# Patient Record
Sex: Female | Born: 1975 | Race: Black or African American | Hispanic: No | Marital: Married | Smoking: Never smoker
Health system: Southern US, Community
[De-identification: ages and names within clinical notes are randomized; demographics above are authoritative.]

## PROBLEM LIST (undated history)

## (undated) DIAGNOSIS — A4902 Methicillin resistant Staphylococcus aureus infection, unspecified site: Secondary | ICD-10-CM

## (undated) DIAGNOSIS — Z87898 Personal history of other specified conditions: Secondary | ICD-10-CM

## (undated) DIAGNOSIS — E282 Polycystic ovarian syndrome: Secondary | ICD-10-CM

## (undated) DIAGNOSIS — L680 Hirsutism: Secondary | ICD-10-CM

## (undated) DIAGNOSIS — D571 Sickle-cell disease without crisis: Secondary | ICD-10-CM

## (undated) DIAGNOSIS — E039 Hypothyroidism, unspecified: Secondary | ICD-10-CM

## (undated) DIAGNOSIS — L732 Hidradenitis suppurativa: Secondary | ICD-10-CM

## (undated) DIAGNOSIS — Z975 Presence of (intrauterine) contraceptive device: Secondary | ICD-10-CM

## (undated) DIAGNOSIS — M1990 Unspecified osteoarthritis, unspecified site: Secondary | ICD-10-CM

## (undated) DIAGNOSIS — Z8639 Personal history of other endocrine, nutritional and metabolic disease: Secondary | ICD-10-CM

## (undated) HISTORY — DX: Personal history of other specified conditions: Z87.898

## (undated) HISTORY — DX: Unspecified osteoarthritis, unspecified site: M19.90

## (undated) HISTORY — DX: Presence of (intrauterine) contraceptive device: Z97.5

## (undated) HISTORY — PX: CHOLECYSTECTOMY: SHX55

## (undated) HISTORY — DX: Methicillin resistant Staphylococcus aureus infection, unspecified site: A49.02

## (undated) HISTORY — DX: Hirsutism: L68.0

## (undated) HISTORY — DX: Hypothyroidism, unspecified: E03.9

## (undated) HISTORY — PX: REDUCTION MAMMAPLASTY: SUR839

## (undated) HISTORY — DX: Personal history of other endocrine, nutritional and metabolic disease: Z86.39

## (undated) HISTORY — DX: Hidradenitis suppurativa: L73.2

## (undated) HISTORY — DX: Polycystic ovarian syndrome: E28.2

## (undated) HISTORY — DX: Sickle-cell disease without crisis: D57.1

---

## 1997-05-22 HISTORY — PX: BREAST REDUCTION SURGERY: SHX8

## 2013-06-04 HISTORY — PX: THYROIDECTOMY: SHX17

## 2015-03-19 LAB — HM MAMMOGRAPHY

## 2015-08-21 HISTORY — PX: OTHER SURGICAL HISTORY: SHX169

## 2016-05-01 ENCOUNTER — Ambulatory Visit: Payer: Self-pay | Admitting: Nurse Practitioner

## 2016-06-12 ENCOUNTER — Ambulatory Visit: Payer: Self-pay | Admitting: Nurse Practitioner

## 2016-07-12 ENCOUNTER — Encounter: Payer: Self-pay | Admitting: Family Medicine

## 2016-07-12 ENCOUNTER — Ambulatory Visit (INDEPENDENT_AMBULATORY_CARE_PROVIDER_SITE_OTHER): Payer: Managed Care, Other (non HMO) | Admitting: Family Medicine

## 2016-07-12 VITALS — BP 122/78 | HR 90 | Ht 65.75 in | Wt 194.8 lb

## 2016-07-12 DIAGNOSIS — Z975 Presence of (intrauterine) contraceptive device: Secondary | ICD-10-CM

## 2016-07-12 DIAGNOSIS — Z8639 Personal history of other endocrine, nutritional and metabolic disease: Secondary | ICD-10-CM | POA: Diagnosis not present

## 2016-07-12 DIAGNOSIS — L732 Hidradenitis suppurativa: Secondary | ICD-10-CM | POA: Insufficient documentation

## 2016-07-12 DIAGNOSIS — Z87898 Personal history of other specified conditions: Secondary | ICD-10-CM

## 2016-07-12 DIAGNOSIS — Z8742 Personal history of other diseases of the female genital tract: Secondary | ICD-10-CM

## 2016-07-12 DIAGNOSIS — L680 Hirsutism: Secondary | ICD-10-CM | POA: Insufficient documentation

## 2016-07-12 DIAGNOSIS — M25561 Pain in right knee: Secondary | ICD-10-CM | POA: Diagnosis not present

## 2016-07-12 DIAGNOSIS — Z1239 Encounter for other screening for malignant neoplasm of breast: Secondary | ICD-10-CM

## 2016-07-12 DIAGNOSIS — E890 Postprocedural hypothyroidism: Secondary | ICD-10-CM

## 2016-07-12 DIAGNOSIS — M25562 Pain in left knee: Secondary | ICD-10-CM

## 2016-07-12 DIAGNOSIS — A4902 Methicillin resistant Staphylococcus aureus infection, unspecified site: Secondary | ICD-10-CM | POA: Insufficient documentation

## 2016-07-12 DIAGNOSIS — Z1231 Encounter for screening mammogram for malignant neoplasm of breast: Secondary | ICD-10-CM

## 2016-07-12 LAB — COMPREHENSIVE METABOLIC PANEL
ALBUMIN: 4.1 g/dL (ref 3.6–5.1)
ALT: 9 U/L (ref 6–29)
AST: 12 U/L (ref 10–30)
Alkaline Phosphatase: 76 U/L (ref 33–115)
BILIRUBIN TOTAL: 0.8 mg/dL (ref 0.2–1.2)
BUN: 10 mg/dL (ref 7–25)
CALCIUM: 9.4 mg/dL (ref 8.6–10.2)
CHLORIDE: 104 mmol/L (ref 98–110)
CO2: 26 mmol/L (ref 20–31)
Creat: 0.73 mg/dL (ref 0.50–1.10)
Glucose, Bld: 87 mg/dL (ref 65–99)
Potassium: 4.5 mmol/L (ref 3.5–5.3)
Sodium: 139 mmol/L (ref 135–146)
Total Protein: 7 g/dL (ref 6.1–8.1)

## 2016-07-12 LAB — CBC WITH DIFFERENTIAL/PLATELET
BASOS ABS: 0 {cells}/uL (ref 0–200)
Basophils Relative: 0 %
Eosinophils Absolute: 62 cells/uL (ref 15–500)
Eosinophils Relative: 1 %
HEMATOCRIT: 37.8 % (ref 35.0–45.0)
HEMOGLOBIN: 12 g/dL (ref 11.7–15.5)
LYMPHS ABS: 2666 {cells}/uL (ref 850–3900)
Lymphocytes Relative: 43 %
MCH: 28.3 pg (ref 27.0–33.0)
MCHC: 31.7 g/dL — AB (ref 32.0–36.0)
MCV: 89.2 fL (ref 80.0–100.0)
MPV: 10.1 fL (ref 7.5–12.5)
Monocytes Absolute: 372 cells/uL (ref 200–950)
Monocytes Relative: 6 %
NEUTROS PCT: 50 %
Neutro Abs: 3100 cells/uL (ref 1500–7800)
Platelets: 340 10*3/uL (ref 140–400)
RBC: 4.24 MIL/uL (ref 3.80–5.10)
RDW: 13.8 % (ref 11.0–15.0)
WBC: 6.2 10*3/uL (ref 4.0–10.5)

## 2016-07-12 LAB — LIPID PANEL
CHOL/HDL RATIO: 2.6 ratio (ref <5.0)
CHOLESTEROL: 188 mg/dL (ref <200)
HDL: 72 mg/dL (ref >50)
LDL Cholesterol: 104 mg/dL — ABNORMAL HIGH (ref <100)
TRIGLYCERIDES: 62 mg/dL (ref <150)
VLDL: 12 mg/dL (ref <30)

## 2016-07-12 LAB — TSH: TSH: 2.4 mIU/L

## 2016-07-12 NOTE — Progress Notes (Signed)
Subjective:    Patient ID: Joanne Hughes, female    DOB: Nov 02, 1975, 41 y.o.   MRN: [id number]  HPI Chief Complaint  Patient presents with  . new pt    new pt get established. knee pain- would like blood work done today jsut to see if anything is going on.    She is new to the practice and here to establish care.  Previous medical care: in [place], [place]. Moved here in September 2017.  Last CPE: April 2017   Complains of a 2-4 week history of gradually worsening left knee pain that is mainly medial and only bothersome when walking up and down steps.  She also reports anterior right knee pain that is mild and ongoing for about the same length of time. Denies fever, chills, knee swelling, locking, popping or giving away. No pain when sitting or when walking straight away or even on an incline.  She has taken naproxen with relief. Has not stopped doing lunges and squats at her gym. States she has even tried jogging some.   Reports questionable hairline fractures in tibia, cannot recall the details and no records of this.   Denies fever, chills, dizziness, chest pain, palpitations, shortness of breath, abdominal pain, N/V/D. No calf pain.   Other providers: none in the area. She had an endocrinologist in [place].   Past medical history: hypothyroidism post thyroidectomy for  Prediabetes. History of elevated hemoglobin A1c but not in diabetes range.   Surgeries: gastric sleeve April 2017.  Has done well with this and weight loss almost 100 lbs since the surgery.  Thyroid removed due to large goiter January 2015.  Bilateral arm pit cyst removal. Breast reduction.   Family history: stroke in father. HTN both parents. Sister with diabetes. Thyroid issues in several family members.   Social history: Lives with husband and son, works for [employer] in [employer].  Diet: mainly healthy Excerise: most days per week.   Immunizations: Up to date.   Health maintenance:  Mammogram: 2016.  States she is due for this.  Colonoscopy: N/A Pap Smear: April 2017 Has an IUD that is due to be removed in the next year and would like a new one. Will need referral for this.  Last Menstrual cycle: irregular  Pregnancies: 1  Reviewed allergies, medications, past medical, surgical, family, and social history.    Review of Systems Pertinent positives and negatives in the history of present illness.     Objective:   Physical Exam  Constitutional: She is oriented to person, place, and time. She appears well-developed and well-nourished. No distress.  Cardiovascular: Normal rate, regular rhythm, normal heart sounds and intact distal pulses.   Pulmonary/Chest: Effort normal and breath sounds normal.  Musculoskeletal:       Right knee: Normal.       Left knee: Normal.  No erythema, effusion, tenderness, laxity. Normal patellar motion. Normal ROM. Negative Mcmurrays.  LEs are neurovascularly intact.   Neurological: She is alert and oriented to person, place, and time.  Skin: Skin is warm and dry. No erythema.   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 Advised her to stop doing lunges, squats and jogging. Encouraged low impact exercises. She may take Tylenol or Aleve as needed, ice, and demonstrated quad strengthening exercises for her to  do such as terminal quad extensions. She will follow up if not improving in the next 2-3 weeks or if the pain gets worse.  Will check TSH and ensure she is taking the correct dose of levothyroxine.  she appears to be very aware of her health and takes good care of herself overall. She is up to date She would like to see a dermatologist and she will call and schedule this.  Requests to get a mammogram. Order placed.  She would like to get her IUD removed and a new one placed in the next few months. Will refer her to OB/GYN.  Follow up pending labs.

## 2016-07-12 NOTE — Patient Instructions (Addendum)
Stop doing lunges and squats and exercises that aggravate your knee. Do the knee strengthening exercises we discussed. Do low impact exercises for now.  You can take Tylenol or naproxen.  Follow up if you are not noticing improvement in the next 2-3 weeks.   Patellofemoral Pain Syndrome Introduction Patellofemoral pain syndrome is a condition that involves a softening or breakdown of the tissue (cartilage) on the underside of your kneecap (patella). This causes pain in the front of the knee. The condition is also called runner's knee or chondromalacia patella. Patellofemoral pain syndrome is most common in young adults who are active in sports. Your knee is the largest joint in your body. The patella covers the front of your knee and is attached to muscles above and below your knee. The underside of the patella is covered with a smooth type of cartilage (synovium). The smooth surface helps the patella glide easily when you move your knee. Patellofemoral pain syndrome causes swelling in the joint linings and bone surfaces in your knee. What are the causes? Patellofemoral pain syndrome can be caused by:  Overuse.  Poor alignment of your knee joints.  Weak leg muscles.  A direct blow to your kneecap. What increases the risk? You may be at risk for patellofemoral pain syndrome if you:  Do a lot of activities that can wear down your kneecap. These include:  Running.  Squatting.  Climbing stairs.  Start a new physical activity or exercise program.  Wear shoes that do not fit well.  Do not have good leg strength.  Are overweight. What are the signs or symptoms? Knee pain is the most common symptom of patellofemoral pain syndrome. This may feel like a dull, aching pain underneath your patella, in the front of your knee. There may be a popping or cracking sound when you move your knee. Pain may get worse with:  Exercise.  Climbing  stairs.  Running.  Jumping.  Squatting.  Kneeling.  Sitting for a long time.  Moving or pushing on your patella. How is this diagnosed? Your health care provider may be able to diagnose patellofemoral pain syndrome from your symptoms and medical history. You may be asked about your recent physical activities and which ones cause knee pain. Your health care provider may do a physical exam with certain tests to confirm the diagnosis. These may include:  Moving your patella back and forth.  Checking your range of knee motion.  Having you squat or jump to see if you have pain.  Checking the strength of your leg muscles. An MRI of the knee may also be done. How is this treated? Patellofemoral pain syndrome can usually be treated at home with rest, ice, compression, and elevation (RICE). Other treatments may include:  Nonsteroidal anti-inflammatory drugs (NSAIDs).  Physical therapy to stretch and strengthen your leg muscles.  Shoe inserts (orthotics) to take stress off your knee.  A knee brace or knee support.  Surgery to remove damaged cartilage or move the patella to a better position. The need for surgery is rare. Follow these instructions at home:  Take medicines only as directed by your health care provider.  Rest your knee.  When resting, keep your knee raised above the level of your heart.  Avoid activities that cause knee pain.  Apply ice to the injured area:  Put ice in a plastic bag.  Place a towel between your skin and the bag.  Leave the ice on for 20 minutes, 2-3 times a day.  Use splints, braces, knee supports, or walking aids as directed by your health care provider.  Perform stretching and strengthening exercises as directed by your health care provider or physical therapist.  Keep all follow-up visits as directed by your health care provider. This is important. Contact a health care provider if:  Your symptoms get worse.  You are not improving  with home care. This information is not intended to replace advice given to you by your health care provider. Make sure you discuss any questions you have with your health care provider. Document Released: 04/26/2009 Document Revised: 10/14/2015 Document Reviewed: 07/28/2013  2017 Elsevier

## 2016-07-13 ENCOUNTER — Other Ambulatory Visit: Payer: Self-pay | Admitting: Family Medicine

## 2016-07-13 MED ORDER — LEVOTHYROXINE SODIUM 137 MCG PO TABS: 137.0000 ug | ORAL_TABLET | Freq: Every day | ORAL | 5 refills | Status: DC

## 2016-07-14 ENCOUNTER — Encounter: Payer: Self-pay | Admitting: Family Medicine

## 2016-07-25 ENCOUNTER — Encounter: Payer: Self-pay | Admitting: Internal Medicine

## 2016-08-07 ENCOUNTER — Ambulatory Visit
Admission: RE | Admit: 2016-08-07 | Discharge: 2016-08-07 | Disposition: A | Discharge status: Home / Self Care | Payer: Managed Care, Other (non HMO) | Source: Ambulatory Visit | Attending: Family Medicine | Admitting: Family Medicine

## 2016-08-07 DIAGNOSIS — Z1239 Encounter for other screening for malignant neoplasm of breast: Secondary | ICD-10-CM

## 2016-08-08 ENCOUNTER — Encounter: Payer: Self-pay | Admitting: Family Medicine

## 2016-08-09 ENCOUNTER — Other Ambulatory Visit: Payer: Self-pay | Admitting: Family Medicine

## 2016-08-09 DIAGNOSIS — M25562 Pain in left knee: Secondary | ICD-10-CM

## 2016-08-22 ENCOUNTER — Encounter: Payer: Self-pay | Admitting: Family Medicine

## 2016-08-22 ENCOUNTER — Ambulatory Visit
Admission: RE | Admit: 2016-08-22 | Discharge: 2016-08-22 | Disposition: A | Discharge status: Home / Self Care | Payer: Managed Care, Other (non HMO) | Source: Ambulatory Visit | Attending: Family Medicine | Admitting: Family Medicine

## 2016-08-22 DIAGNOSIS — M25562 Pain in left knee: Secondary | ICD-10-CM

## 2016-08-22 DIAGNOSIS — M25561 Pain in right knee: Secondary | ICD-10-CM

## 2016-08-28 ENCOUNTER — Ambulatory Visit (INDEPENDENT_AMBULATORY_CARE_PROVIDER_SITE_OTHER): Payer: Managed Care, Other (non HMO) | Admitting: Obstetrics and Gynecology

## 2016-08-28 ENCOUNTER — Encounter: Payer: Self-pay | Admitting: Obstetrics and Gynecology

## 2016-08-28 VITALS — BP 139/88 | HR 71 | Ht 65.0 in | Wt 193.0 lb

## 2016-08-28 DIAGNOSIS — Z113 Encounter for screening for infections with a predominantly sexual mode of transmission: Secondary | ICD-10-CM

## 2016-08-28 DIAGNOSIS — Z124 Encounter for screening for malignant neoplasm of cervix: Secondary | ICD-10-CM

## 2016-08-28 DIAGNOSIS — Z30433 Encounter for removal and reinsertion of intrauterine contraceptive device: Secondary | ICD-10-CM | POA: Diagnosis not present

## 2016-08-28 DIAGNOSIS — Z01419 Encounter for gynecological examination (general) (routine) without abnormal findings: Secondary | ICD-10-CM

## 2016-08-28 MED ORDER — LEVONORGESTREL 20 MCG/24HR IU IUD
INTRAUTERINE_SYSTEM | Freq: Once | INTRAUTERINE | Status: AC
  Administered 2016-08-28: 14:00:00 1 via INTRAUTERINE

## 2016-08-28 NOTE — Progress Notes (Signed)
Patient is in the office for annual exam. 

## 2016-08-28 NOTE — Progress Notes (Signed)
Subjective:     Joanne Hughes is a 41 y.o. female G80P1011 with BMI 32 who is here for a comprehensive physical exam. The patient reports no problems. She is sexually active using IUD for contraception. She has had the Mirena in place for the past 6 years and desires removal and replacement. She denies any pelvic pain or abnormal vaginal discharge  Past Medical History:  Diagnosis Date  . Hidradenitis   . Hirsutism    tried spironolactone and vaniqa in past without success  . History of elevated lipids   . History of prediabetes    A1c 5.9% January 2017  . Hypothyroid   . IUD contraception   . MRSA (methicillin resistant Staphylococcus aureus)    per medical record from [place]  . PCOS (polycystic ovarian syndrome)    Past Surgical History:  Procedure Laterality Date  . BREAST REDUCTION SURGERY  05/1997  . CESAREAN SECTION    . CHOLECYSTECTOMY    . gastric sleeve surgery  08/2015   surgeon Dr. Roanna Raider of [place]  . THYROIDECTOMY  06/04/2013   surgeon Dr. Haywood Pao of [place]   Family History  Problem Relation Age of Onset  . Hypertension Mother   . Hypothyroidism Mother   . Hypertension Father   . Stroke Father   . Diabetes Sister   . Leukemia Sister      Social History   Social History  . Marital status: Married    Spouse name: N/A  . Number of children: N/A  . Years of education: N/A   Occupational History  . Not on file.   Social History Main Topics  . Smoking status: Never Smoker  . Smokeless tobacco: Never Used  . Alcohol use 1.2 oz/week    2 Glasses of wine per week  . Drug use: No  . Sexual activity: Yes    Birth control/ protection: IUD   Other Topics Concern  . Not on file   Social History Narrative  . No narrative on file   Health Maintenance  Topic Date Due  . HIV Screening  10/15/1989  . TETANUS/TDAP  10/15/1993  . PAP SMEAR  10/16/1995  . INFLUENZA VACCINE  12/20/2016       Review of Systems Pertinent items are noted in  HPI.   Objective:  Blood pressure 139/88, pulse 71, height 5\' 5"  (1.651 m), weight 193 lb (87.5 kg), last menstrual period 08/26/2016.     GENERAL: Well-developed, well-nourished female in no acute distress.  HEENT: Normocephalic, atraumatic. Sclerae anicteric.  NECK: Supple. Normal thyroid.  LUNGS: Clear to auscultation bilaterally.  HEART: Regular rate and rhythm. BREASTS: Symmetric in size. No palpable masses or lymphadenopathy, skin changes, or nipple drainage. ABDOMEN: Soft, nontender, nondistended.  PELVIC: Normal external female genitalia. Vagina is pink and rugated.  Normal discharge. Normal appearing cervix. Uterus is normal in size. No adnexal mass or tenderness. EXTREMITIES: No cyanosis, clubbing, or edema, 2+ distal pulses.    Assessment:    Healthy female exam.      Plan:    pap smear collected Patient with normal mammogram in March Patient will be contacted with any abnormal results  [place] NOTE  IUD Removal  Patient identified, informed consent performed, consent signed.  Patient was in the dorsal lithotomy position, normal external genitalia was noted.  A speculum was placed in the patient's vagina, normal discharge was noted, no lesions. The cervix was visualized, no lesions, no abnormal discharge.  The strings of the IUD  were grasped and pulled using ring forceps. The IUD was removed in its entirety.     IUD Insertion Cervix  was cleaned with Betadine x 2.  Grasped anteriorly with a single tooth tenaculum.  Uterus sounded to 9 cm.  Mirena IUD placed per manufacturer's recommendations.  Strings trimmed to 3 cm. Tenaculum was removed, good hemostasis noted.  Patient tolerated procedure well.   Patient given post procedure instructions and Mirena care card with expiration date.  Patient is asked to check IUD strings periodically and follow up in 4-6 weeks for IUD check.

## 2016-08-29 LAB — CERVICOVAGINAL ANCILLARY ONLY
Bacterial vaginitis: NEGATIVE
Candida vaginitis: NEGATIVE
Chlamydia: NEGATIVE
NEISSERIA GONORRHEA: NEGATIVE
TRICH (WINDOWPATH): NEGATIVE

## 2016-08-30 ENCOUNTER — Encounter: Payer: Self-pay | Admitting: Family Medicine

## 2016-08-30 LAB — CYTOLOGY - PAP
Diagnosis: NEGATIVE
HPV: NOT DETECTED

## 2016-09-12 ENCOUNTER — Ambulatory Visit (INDEPENDENT_AMBULATORY_CARE_PROVIDER_SITE_OTHER): Payer: Managed Care, Other (non HMO) | Admitting: Orthopaedic Surgery

## 2016-09-12 DIAGNOSIS — M25562 Pain in left knee: Secondary | ICD-10-CM | POA: Diagnosis not present

## 2016-09-12 MED ORDER — LIDOCAINE HCL 1 % IJ SOLN
3.0000 mL | INTRAMUSCULAR | Status: AC | PRN
  Administered 2016-09-12: 3 mL

## 2016-09-12 MED ORDER — METHYLPREDNISOLONE ACETATE 40 MG/ML IJ SUSP
40.0000 mg | INTRAMUSCULAR | Status: AC | PRN
  Administered 2016-09-12: 40 mg via INTRA_ARTICULAR

## 2016-09-12 NOTE — Progress Notes (Signed)
Office Visit Note   Patient: Joanne Hughes           Date of Birth: 23-Jul-1975           MRN: [id number] Visit Date: 09/12/2016              Requested by: Girtha Rm, NP-C [place], [place] [id number] PCP: Harland Dingwall, NP-C   Assessment & Plan: Visit Diagnoses:  1. Acute pain of left knee     Plan: We talked in length about her knee and thinks she can do to help strengthen her quad muscles which I think will help her knees in general. We will focus on the left knee for now. Her right knee is not having the same symptoms just a little bit of pain and maybe over compensating. I had her demonstrate quad exercises back to me and I showed her effusion to try. I offered her steroid injection in her left knee to see if this would help with synovitis. I prepped her knee with better alcohol and the injection well. We'll put for follow-up in 4 weeks to see if this is done well for her. She continues to have problems with pain on pivoting activities will may consider an MRI of her left knee. All questions were encouraged and answered.  Follow-Up Instructions: Return in about 4 weeks (around 10/10/2016).   Orders:  No orders of the defined types were placed in this encounter.  No orders of the defined types were placed in this encounter.     Procedures: Large Joint Inj Date/Time: 09/12/2016 10:04 AM Performed by: Mcarthur Rossetti Authorized by: Mcarthur Rossetti   Location:  Knee Site:  L knee Ultrasound Guidance: No   Fluoroscopic Guidance: No   Arthrogram: No   Medications:  3 mL lidocaine 1 %; 40 mg methylPREDNISolone acetate 40 MG/ML     Clinical Data: No additional findings.   Subjective: No chief complaint on file. The patient comes in with chief complaint of bilateral knee pain is really her left is been hurting her worse. She points the medial joint line as source of her pain. She denies any specific injury. She is to be morbidly obese at  a weight of close to 300 pounds and had gastric bypass surgery and she is also at least 80 pounds or more. She is working on gaining more weight off. She's not been exercising quite a bit. She would like to train eventually for a 5K run. She denies any locking catching but does report pain in the left knee mainly on the medial joint lines were she points and says it hurts only with pivoting types of activities and some going up and downstairs. She's not had significant swelling in the knee. She is incredibly pleasant individual.  HPI  Review of Systems Denies any chest pain, headache, short of breath, fever, chills, vomiting.  Objective: Vital Signs: LMP 08/26/2016   Physical Exam He is alert and oriented 3 and in no acute distress Ortho Exam Examination of both knee show no effusion. Both knees have patella to track well with minimal patellofemoral crepitation. Both knees actually have the ability to hyperextend and fill ligamentously lax but are equal bilaterally. On the left side she does have some slight medial joint line tenderness but a negative McMurray sign. Range of motion of both knees are full. She is neurovascular intact bilaterally. Specialty Comments:  No specialty comments available.  Imaging: No results found. X-rays on the  canopy system and inability reviewed by me of her left knee show well-maintained joint space. There is no acute findings. She has slight patellofemoral arthritic changes but it's only mild. Overall I'm is well-maintained.  PMFS History: Patient Active Problem List   Diagnosis Date Noted  . Acute pain of left knee 09/12/2016  . History of prediabetes 07/12/2016  . History of elevated lipids 07/12/2016  . Postoperative hypothyroidism 07/12/2016  . History of PCOS 07/12/2016  . IUD contraception 07/12/2016  . Hirsutism   . Hidradenitis   . MRSA (methicillin resistant Staphylococcus aureus)    Past Medical History:  Diagnosis Date  . Hidradenitis     . Hirsutism    tried spironolactone and vaniqa in past without success  . History of elevated lipids   . History of prediabetes    A1c 5.9% January 2017  . Hypothyroid   . IUD contraception   . MRSA (methicillin resistant Staphylococcus aureus)    per medical record from [place]  . PCOS (polycystic ovarian syndrome)     Family History  Problem Relation Age of Onset  . Hypertension Mother   . Hypothyroidism Mother   . Hypertension Father   . Stroke Father   . Diabetes Sister   . Leukemia Sister     Past Surgical History:  Procedure Laterality Date  . BREAST REDUCTION SURGERY  05/1997  . CESAREAN SECTION    . CHOLECYSTECTOMY    . gastric sleeve surgery  08/2015   surgeon Dr. Roanna Raider of [place]  . THYROIDECTOMY  06/04/2013   surgeon Dr. Haywood Pao of [place]   Social History   Occupational History  . Not on file.   Social History Main Topics  . Smoking status: Never Smoker  . Smokeless tobacco: Never Used  . Alcohol use 1.2 oz/week    2 Glasses of wine per week  . Drug use: No  . Sexual activity: Yes    Birth control/ protection: IUD

## 2016-09-14 ENCOUNTER — Encounter: Payer: Self-pay | Admitting: Family Medicine

## 2016-09-15 MED ORDER — KETOCONAZOLE 2 % EX SHAM: 1.0000 "application " | MEDICATED_SHAMPOO | CUTANEOUS | 0 refills | Status: DC

## 2016-09-21 ENCOUNTER — Encounter (INDEPENDENT_AMBULATORY_CARE_PROVIDER_SITE_OTHER): Payer: Self-pay | Admitting: Orthopaedic Surgery

## 2016-09-25 ENCOUNTER — Ambulatory Visit (INDEPENDENT_AMBULATORY_CARE_PROVIDER_SITE_OTHER): Payer: Managed Care, Other (non HMO) | Admitting: Obstetrics and Gynecology

## 2016-09-25 ENCOUNTER — Encounter: Payer: Self-pay | Admitting: Obstetrics and Gynecology

## 2016-09-25 VITALS — BP 133/88 | HR 76 | Wt 192.0 lb

## 2016-09-25 DIAGNOSIS — Z30431 Encounter for routine checking of intrauterine contraceptive device: Secondary | ICD-10-CM | POA: Diagnosis not present

## 2016-09-25 NOTE — Progress Notes (Signed)
41 yo s/p IUD placement on 4/9 presenting today for string check. Patient denies any pelvic pain. She reports some light vaginal spotting. She has had intercourse without pain or discomfort.  Past Medical History:  Diagnosis Date  . Hidradenitis   . Hirsutism    tried spironolactone and vaniqa in past without success  . History of elevated lipids   . History of prediabetes    A1c 5.9% January 2017  . Hypothyroid   . IUD contraception   . MRSA (methicillin resistant Staphylococcus aureus)    per medical record from [place]  . PCOS (polycystic ovarian syndrome)    Past Surgical History:  Procedure Laterality Date  . BREAST REDUCTION SURGERY  05/1997  . CESAREAN SECTION    . CHOLECYSTECTOMY    . gastric sleeve surgery  08/2015   surgeon Dr. Roanna Raider of [place]  . THYROIDECTOMY  06/04/2013   surgeon Dr. Haywood Pao of [place]   Family History  Problem Relation Age of Onset  . Hypertension Mother   . Hypothyroidism Mother   . Hypertension Father   . Stroke Father   . Diabetes Sister   . Leukemia Sister    Social History  Substance Use Topics  . Smoking status: Never Smoker  . Smokeless tobacco: Never Used  . Alcohol use 1.2 oz/week    2 Glasses of wine per week   ROS See pertinent in HPI  GENERAL: Well-developed, well-nourished female in no acute distress.  ABDOMEN: Soft, nontender, nondistended.  PELVIC: Normal external female genitalia. Vagina is pink and rugated.  Normal discharge. Normal appearing cervix with IUD strings visualized extending from cervical os. Uterus is normal in size. No adnexal mass or tenderness. EXTREMITIES: No cyanosis, clubbing, or edema, 2+ distal pulses.  A/P 41 yo here for IUD check - IUD appears to be in the appropriate location - Normal pap results from 08/28/2016 reviewed with the patient - RTC prn

## 2016-09-25 NOTE — Progress Notes (Signed)
Patient presents for String Check

## 2016-09-26 ENCOUNTER — Encounter: Payer: Self-pay | Admitting: Family Medicine

## 2016-09-26 DIAGNOSIS — M25561 Pain in right knee: Secondary | ICD-10-CM

## 2016-10-03 ENCOUNTER — Ambulatory Visit
Admission: RE | Admit: 2016-10-03 | Discharge: 2016-10-03 | Disposition: A | Discharge status: Home / Self Care | Payer: Managed Care, Other (non HMO) | Source: Ambulatory Visit | Attending: Family Medicine | Admitting: Family Medicine

## 2016-10-03 DIAGNOSIS — M25561 Pain in right knee: Secondary | ICD-10-CM

## 2016-10-04 ENCOUNTER — Encounter: Payer: Self-pay | Admitting: Family Medicine

## 2016-10-09 ENCOUNTER — Ambulatory Visit: Payer: Self-pay | Admitting: Family Medicine

## 2016-10-10 ENCOUNTER — Encounter: Payer: Self-pay | Admitting: Family Medicine

## 2016-10-10 ENCOUNTER — Ambulatory Visit (INDEPENDENT_AMBULATORY_CARE_PROVIDER_SITE_OTHER): Payer: Managed Care, Other (non HMO) | Admitting: Orthopaedic Surgery

## 2016-10-11 ENCOUNTER — Encounter: Payer: Self-pay | Admitting: Family Medicine

## 2016-11-10 ENCOUNTER — Encounter: Payer: Self-pay | Admitting: Family Medicine

## 2016-11-10 MED ORDER — LEVOTHYROXINE SODIUM 137 MCG PO TABS: 137.0000 ug | ORAL_TABLET | Freq: Every day | ORAL | 1 refills | Status: DC

## 2017-02-05 ENCOUNTER — Encounter: Payer: Self-pay | Admitting: Family Medicine

## 2017-03-01 ENCOUNTER — Other Ambulatory Visit (INDEPENDENT_AMBULATORY_CARE_PROVIDER_SITE_OTHER): Payer: Managed Care, Other (non HMO)

## 2017-03-01 DIAGNOSIS — Z23 Encounter for immunization: Secondary | ICD-10-CM

## 2017-03-15 ENCOUNTER — Encounter: Payer: Self-pay | Admitting: Family Medicine

## 2017-07-02 ENCOUNTER — Encounter: Payer: Self-pay | Admitting: Family Medicine

## 2017-07-09 ENCOUNTER — Encounter: Payer: Self-pay | Admitting: Family Medicine

## 2017-07-09 ENCOUNTER — Ambulatory Visit: Payer: Managed Care, Other (non HMO) | Admitting: Family Medicine

## 2017-07-09 VITALS — BP 124/90 | HR 86 | Wt 191.0 lb

## 2017-07-09 DIAGNOSIS — Z79899 Other long term (current) drug therapy: Secondary | ICD-10-CM | POA: Diagnosis not present

## 2017-07-09 DIAGNOSIS — E890 Postprocedural hypothyroidism: Secondary | ICD-10-CM | POA: Diagnosis not present

## 2017-07-09 DIAGNOSIS — R252 Cramp and spasm: Secondary | ICD-10-CM | POA: Diagnosis not present

## 2017-07-09 DIAGNOSIS — Z87898 Personal history of other specified conditions: Secondary | ICD-10-CM

## 2017-07-09 NOTE — Patient Instructions (Signed)
Eat a low sodium diet and make sure you are well hydrated (your urine should be a light yellow).   We will call you with your lab results.

## 2017-07-09 NOTE — Progress Notes (Signed)
   Subjective:    Patient ID: Joanne Hughes, female    DOB: Oct 02, 1975, 42 y.o.   MRN: [id number]  HPI Chief Complaint  Patient presents with  . cramping    cramping in legs and toes moving in different directions. wants potassium and calcium checked.    She is here with complaints of bilateral LE cramping one night last week. Cramping intermittent in her left foot at times.   She had a gastric bypass 2 years ago. Is concerned that she may not be getting adequate vitamins and minerals.   Denies fever, chills, dizziness, fatigue, night sweats, chest pain, palpitations, shortness of breath, abdominal pain, N/V/D.   She is taking levothyroxine and has not followed up for TSH check.   History of prediabetes.   IUD in place.   Reviewed allergies, medications, past medical, surgical, family, and social history.    Review of Systems Pertinent positives and negatives in the history of present illness.     Objective:   Physical Exam BP 124/90   Pulse 86   Wt 191 lb (86.6 kg)   SpO2 99%   BMI 31.78 kg/m  Alert and in no distress.  Pharyngeal area is normal. Neck is supple without adenopathy or thyromegaly. Cardiac exam shows a regular sinus rhythm without murmurs or gallops. Lungs are clear to auscultation. Extremities without edema, normal pulses.       Assessment & Plan:  Foot cramps - Plan: CBC with Differential/Platelet, Comprehensive metabolic panel, TSH, Magnesium, Hemoglobin A1c  Postoperative hypothyroidism - Plan: TSH  History of prediabetes - Plan: Hemoglobin A1c  Medication management - Plan: TSH  Foot cramps are intermittent. Discussed hydration status and will check labs. She is taking multiple supplements and has a history of gastric bypass.  Check TSH and follow up. Adjust medication as needed.  Follow up pending labs and for a fasting CPE.

## 2017-07-10 LAB — COMPREHENSIVE METABOLIC PANEL
A/G RATIO: 1.5 (ref 1.2–2.2)
ALBUMIN: 4.4 g/dL (ref 3.5–5.5)
ALT: 13 IU/L (ref 0–32)
AST: 19 IU/L (ref 0–40)
Alkaline Phosphatase: 66 IU/L (ref 39–117)
BILIRUBIN TOTAL: 0.5 mg/dL (ref 0.0–1.2)
BUN / CREAT RATIO: 16 (ref 9–23)
BUN: 13 mg/dL (ref 6–24)
CO2: 23 mmol/L (ref 20–29)
Calcium: 9.4 mg/dL (ref 8.7–10.2)
Chloride: 106 mmol/L (ref 96–106)
Creatinine, Ser: 0.8 mg/dL (ref 0.57–1.00)
GFR calc non Af Amer: 91 mL/min/{1.73_m2} (ref >59)
GFR, EST AFRICAN AMERICAN: 105 mL/min/{1.73_m2} (ref >59)
GLOBULIN, TOTAL: 3 g/dL (ref 1.5–4.5)
Glucose: 88 mg/dL (ref 65–99)
POTASSIUM: 4.1 mmol/L (ref 3.5–5.2)
SODIUM: 144 mmol/L (ref 134–144)
TOTAL PROTEIN: 7.4 g/dL (ref 6.0–8.5)

## 2017-07-10 LAB — CBC WITH DIFFERENTIAL/PLATELET
BASOS: 0 %
Basophils Absolute: 0 10*3/uL (ref 0.0–0.2)
EOS (ABSOLUTE): 0.1 10*3/uL (ref 0.0–0.4)
EOS: 2 %
HEMATOCRIT: 37.7 % (ref 34.0–46.6)
Hemoglobin: 11.9 g/dL (ref 11.1–15.9)
Immature Grans (Abs): 0 10*3/uL (ref 0.0–0.1)
Immature Granulocytes: 0 %
Lymphocytes Absolute: 2.7 10*3/uL (ref 0.7–3.1)
Lymphs: 49 %
MCH: 29.3 pg (ref 26.6–33.0)
MCHC: 31.6 g/dL (ref 31.5–35.7)
MCV: 93 fL (ref 79–97)
Monocytes Absolute: 0.3 10*3/uL (ref 0.1–0.9)
Monocytes: 5 %
NEUTROS ABS: 2.4 10*3/uL (ref 1.4–7.0)
Neutrophils: 44 %
PLATELETS: 363 10*3/uL (ref 150–379)
RBC: 4.06 x10E6/uL (ref 3.77–5.28)
RDW: 13.7 % (ref 12.3–15.4)
WBC: 5.5 10*3/uL (ref 3.4–10.8)

## 2017-07-10 LAB — TSH: TSH: 1.67 u[IU]/mL (ref 0.450–4.500)

## 2017-07-10 LAB — HEMOGLOBIN A1C
Est. average glucose Bld gHb Est-mCnc: 111 mg/dL
Hgb A1c MFr Bld: 5.5 % (ref 4.8–5.6)

## 2017-07-10 LAB — MAGNESIUM: Magnesium: 2 mg/dL (ref 1.6–2.3)

## 2017-08-15 ENCOUNTER — Ambulatory Visit: Payer: Managed Care, Other (non HMO) | Admitting: Family Medicine

## 2017-08-23 ENCOUNTER — Other Ambulatory Visit: Payer: Self-pay | Admitting: Family Medicine

## 2017-08-23 ENCOUNTER — Encounter: Payer: Self-pay | Admitting: Family Medicine

## 2017-08-23 ENCOUNTER — Other Ambulatory Visit: Payer: Self-pay

## 2017-08-23 NOTE — Telephone Encounter (Signed)
Please advise if shampoo is ok to  fill. Thanks [employer]

## 2017-08-23 NOTE — Telephone Encounter (Signed)
This is ok

## 2017-08-24 ENCOUNTER — Other Ambulatory Visit: Payer: Self-pay

## 2017-08-24 MED ORDER — KETOCONAZOLE 2 % EX SHAM: 1.0000 "application " | MEDICATED_SHAMPOO | CUTANEOUS | 0 refills | Status: DC

## 2017-08-24 NOTE — Telephone Encounter (Signed)
Done KH 

## 2017-09-04 NOTE — Progress Notes (Signed)
Subjective:    Patient ID: Joanne Hughes, female    DOB: 07-Apr-1976, 42 y.o.   MRN: [id number]  HPI Chief Complaint  Patient presents with  . fasting cpe    fasting cpe, skin irriation-    She is here for a complete physical exam.  Other providers: OB/GYN-  Dr. Elly Modena Orthopedist- Dr. 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 States this started flaring up a couple of months ago. Reports having an open sore there currently that is draining. States this has been coming and going. Plans to see her dermatologist about this.  She has a history of MRSA and history of hidradenitis but no concerns relate to this today.   She also reports history of seasonal allergies and has been sniffling lately. No sinus pain or pressure. No congestion, sore throat or cough.    Social history: Lives with husband and child who is 75, works for [employer]  Denies smoking, drug use and drinks red wine on the weekends.  Diet: fairly healthy. 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 Negative HPV.  Last Menstrual cycle: 07/2017 Has an IUD  Last Dental Exam: twice annually  Last Eye Exam: due   Wears seatbelt always,smoke detectors in home and functioning, does not text while driving and feels safe in home environment.   Reviewed allergies, medications, past medical, surgical, family, and social history.    Review of Systems Review of Systems Constitutional: -fever, -chills, -sweats, -unexpected weight change,-fatigue ENT: +runny nose, -ear pain, -sore throat Cardiology:  -chest pain, -palpitations, -edema Respiratory: -cough, -shortness of breath,  -wheezing Gastroenterology: -abdominal pain, -nausea, -vomiting, -diarrhea, -constipation  Hematology: -bleeding or bruising problems Musculoskeletal: -arthralgias, -myalgias, -joint swelling, -back pain Ophthalmology: -vision changes Urology: -dysuria, -difficulty urinating, -hematuria, -urinary frequency, -urgency Neurology: -headache, -weakness, -tingling, -numbness       Objective:   Physical Exam BP 120/80   Pulse 73   Ht 5\' 6"  (1.676 m)   Wt 190 lb (86.2 kg)   LMP 08/15/2017   BMI 30.67 kg/m   General Appearance:    Alert, cooperative, no distress, appears stated age  Head:    Normocephalic, without obvious abnormality, atraumatic  Eyes:    PERRL, conjunctiva/corneas clear, EOM's intact  Ears:    Normal TM's and external ear canals  Nose:   Nares with erythema and mild edema R>L, no drainage or sinus tenderness  Throat:   Lips, mucosa, and tongue normal; teeth and gums normal  Neck:   Supple, no lymphadenopathy;  thyroid:  no   enlargement/tenderness/nodules; no carotid   bruit or JVD  Back:    Spine nontender, no curvature, ROM normal, no CVA     tenderness  Lungs:     Clear to auscultation bilaterally without wheezes, rales or     ronchi; respirations unlabored  Chest Wall:    No tenderness or deformity   Heart:    Regular rate and rhythm, S1 and S2 normal, no murmur, rub   or gallop  Breast Exam:    OB/GYN  Abdomen:     Soft, non-tender, nondistended, normoactive bowel sounds,    no masses, no hepatosplenomegaly  Genitalia:    OB/GYN     Extremities:   No clubbing, cyanosis or edema  Pulses:   2+ and symmetric all extremities  Skin:   Skin color, texture, turgor normal, no rashes. Open, small, round, superficial wound draining clear fluid, no surrounding erythema, no induration or fluctuance.   Lymph nodes:   Cervical, supraclavicular, and axillary nodes normal  Neurologic:   CNII-XII intact, normal strength, sensation and gait; reflexes 2+ and symmetric throughout            Psych:   Normal mood, affect, hygiene and grooming.    Urinalysis dipstick: blood 2+ (menses starting soon per patient)       Assessment & Plan:  Routine general medical examination at a health care facility - Plan: POCT Urinalysis DIP (Proadvantage Device)  Postoperative hypothyroidism  Elevated LDL cholesterol level - Plan: Lipid panel  Abscess of skin of abdomen - Plan: mupirocin ointment (BACTROBAN) 2 %  Allergic rhinitis, unspecified seasonality, unspecified trigger  She is pleasant and well-appearing. Discussed healthy lifestyle and she appears to be doing a good job of eating healthy and being physically active. She does have an open sore on her lower abdomen that I will prescribe Bactroban for.  No obvious signs of infectious process.  She does have a history of MRSA.  She plans to see her dermatologist about this since it does keep flaring up periodically. History of seasonal allergies and she is having some rhinitis related to this.  I recommend that she start taking a nondrowsy antihistamine and continue using Flonase. Review of recent labs from 06/2017 and her TSH was normal.  She is fasting today so we will check an LDL.  It was mildly elevated last year. Immunizations up-to-date per patient.  I will attempt to get these from [facility] health employee wellness. She will be getting her mammogram in the next few weeks.  Pap smear is up-to-date. Follow-up pending labs or as needed. Her urine was resulted after she left the office.  It did have a small amount of blood so I would like for her to return in 2-3 weeks for a repeat UA.

## 2017-09-05 ENCOUNTER — Encounter: Payer: Self-pay | Admitting: Family Medicine

## 2017-09-05 ENCOUNTER — Other Ambulatory Visit: Payer: Self-pay | Admitting: Family Medicine

## 2017-09-05 ENCOUNTER — Ambulatory Visit: Payer: Managed Care, Other (non HMO) | Admitting: Family Medicine

## 2017-09-05 VITALS — BP 120/80 | HR 73 | Ht 66.0 in | Wt 190.0 lb

## 2017-09-05 DIAGNOSIS — Z1231 Encounter for screening mammogram for malignant neoplasm of breast: Secondary | ICD-10-CM

## 2017-09-05 DIAGNOSIS — E890 Postprocedural hypothyroidism: Secondary | ICD-10-CM

## 2017-09-05 DIAGNOSIS — R319 Hematuria, unspecified: Secondary | ICD-10-CM

## 2017-09-05 DIAGNOSIS — L02211 Cutaneous abscess of abdominal wall: Secondary | ICD-10-CM

## 2017-09-05 DIAGNOSIS — E7800 Pure hypercholesterolemia, unspecified: Secondary | ICD-10-CM

## 2017-09-05 DIAGNOSIS — Z0000 Encounter for general adult medical examination without abnormal findings: Secondary | ICD-10-CM | POA: Diagnosis not present

## 2017-09-05 DIAGNOSIS — J309 Allergic rhinitis, unspecified: Secondary | ICD-10-CM

## 2017-09-05 LAB — LIPID PANEL
CHOLESTEROL TOTAL: 195 mg/dL (ref 100–199)
Chol/HDL Ratio: 2.8 ratio (ref 0.0–4.4)
HDL: 70 mg/dL (ref >39)
LDL CALC: 115 mg/dL — AB (ref 0–99)
Triglycerides: 50 mg/dL (ref 0–149)
VLDL Cholesterol Cal: 10 mg/dL (ref 5–40)

## 2017-09-05 LAB — POCT URINALYSIS DIP (PROADVANTAGE DEVICE)
BILIRUBIN UA: NEGATIVE
GLUCOSE UA: NEGATIVE mg/dL
Ketones, POC UA: NEGATIVE mg/dL
LEUKOCYTES UA: NEGATIVE
NITRITE UA: NEGATIVE
Protein Ur, POC: NEGATIVE mg/dL
Specific Gravity, Urine: 1.025
Urobilinogen, Ur: NEGATIVE
pH, UA: 6 (ref 5.0–8.0)

## 2017-09-05 MED ORDER — MUPIROCIN 2 % EX OINT: 1.0000 "application " | TOPICAL_OINTMENT | Freq: Two times a day (BID) | CUTANEOUS | 0 refills | Status: DC

## 2017-09-05 MED FILL — MUPIROCIN 2% OINTMENT: 2 | 25 days supply | Qty: 22 | Fill #0

## 2017-09-05 NOTE — Patient Instructions (Addendum)
Try taking a daily non drowsy antihistamine such as Zyrtec, Claritin, Allegra and you may continue using the Flonase.  Use the Bactroban as prescribed. See your dermatologist.   Follow up pending labs or in one year.   Preventative Care for Adults - Female      MAINTAIN REGULAR HEALTH EXAMS:  A routine yearly physical is a good way to check in with your primary care provider about your health and preventive screening. It is also an opportunity to share updates about your health and any concerns you have, and receive a thorough all-over exam.   Most health insurance companies pay for at least some preventative services.  Check with your health plan for specific coverages.  WHAT PREVENTATIVE SERVICES DO WOMEN NEED?  Adult women should have their weight and blood pressure checked regularly.   Women age 43 and older should have their cholesterol levels checked regularly.  Women should be screened for cervical cancer with a Pap smear and pelvic exam beginning at age 43.  Breast cancer screening generally begins at age 43 with a mammogram and breast exam by your primary care provider.    Beginning at age 43 and continuing to age 33, women should be screened for colorectal cancer.  Certain people may need continued testing until age 43.  Updating vaccinations is part of preventative care.  Vaccinations help protect against diseases such as the flu.  Osteoporosis is a disease in which the bones lose minerals and strength as we age. Women ages 43 and over should discuss this with their caregivers, as should women after menopause who have other risk factors.  Lab tests are generally done as part of preventative care to screen for anemia and blood disorders, to screen for problems with the kidneys and liver, to screen for bladder problems, to check blood sugar, and to check your cholesterol level.  Preventative services generally include counseling about diet, exercise, avoiding tobacco, drugs,  excessive alcohol consumption, and sexually transmitted infections.    GENERAL RECOMMENDATIONS FOR GOOD HEALTH:  Healthy diet:  Eat a variety of foods, including fruit, vegetables, animal or vegetable protein, such as meat, fish, chicken, and eggs, or beans, lentils, tofu, and grains, such as rice.  Drink plenty of water daily.  Decrease saturated fat in the diet, avoid lots of red meat, processed foods, sweets, fast foods, and fried foods.  Exercise:  Aerobic exercise helps maintain good heart health. At least 30-40 minutes of moderate-intensity exercise is recommended. For example, a brisk walk that increases your heart rate and breathing. This should be done on most days of the week.   Find a type of exercise or a variety of exercises that you enjoy so that it becomes a part of your daily life.  Examples are running, walking, swimming, water aerobics, and biking.  For motivation and support, explore group exercise such as aerobic class, spin class, Zumba, Yoga,or  martial arts, etc.    Set exercise goals for yourself, such as a certain weight goal, walk or run in a race such as a 5k walk/run.  Speak to your primary care provider about exercise goals.  Disease prevention:  If you smoke or chew tobacco, find out from your caregiver how to quit. It can literally save your life, no matter how long you have been a tobacco user. If you do not use tobacco, never begin.   Maintain a healthy diet and normal weight. Increased weight leads to problems with blood pressure and diabetes.  The Body Mass Index or BMI is a way of measuring how much of your body is fat. Having a BMI above 27 increases the risk of heart disease, diabetes, hypertension, stroke and other problems related to obesity. Your caregiver can help determine your BMI and based on it develop an exercise and dietary program to help you achieve or maintain this important measurement at a healthful level.  High blood pressure causes  heart and blood vessel problems.  Persistent high blood pressure should be treated with medicine if weight loss and exercise do not work.   Fat and cholesterol leaves deposits in your arteries that can block them. This causes heart disease and vessel disease elsewhere in your body.  If your cholesterol is found to be high, or if you have heart disease or certain other medical conditions, then you may need to have your cholesterol monitored frequently and be treated with medication.   Ask if you should have a cardiac stress test if your history suggests this. A stress test is a test done on a treadmill that looks for heart disease. This test can find disease prior to there being a problem.  Menopause can be associated with physical symptoms and risks. Hormone replacement therapy is available to decrease these. You should talk to your caregiver about whether starting or continuing to take hormones is right for you.   Osteoporosis is a disease in which the bones lose minerals and strength as we age. This can result in serious bone fractures. Risk of osteoporosis can be identified using a bone density scan. Women ages 43 and over should discuss this with their caregivers, as should women after menopause who have other risk factors. Ask your caregiver whether you should be taking a calcium supplement and Vitamin D, to reduce the rate of osteoporosis.   Avoid drinking alcohol in excess (more than two drinks per day).  Avoid use of street drugs. Do not share needles with anyone. Ask for professional help if you need assistance or instructions on stopping the use of alcohol, cigarettes, and/or drugs.  Brush your teeth twice a day with fluoride toothpaste, and floss once a day. Good oral hygiene prevents tooth decay and gum disease. The problems can be painful, unattractive, and can cause other health problems. Visit your dentist for a routine oral and dental check up and preventive care every 6-12 months.    Look at your skin regularly.  Use a mirror to look at your back. Notify your caregivers of changes in moles, especially if there are changes in shapes, colors, a size larger than a pencil eraser, an irregular border, or development of new moles.  Safety:  Use seatbelts 100% of the time, whether driving or as a passenger.  Use safety devices such as hearing protection if you work in environments with loud noise or significant background noise.  Use safety glasses when doing any work that could send debris in to the eyes.  Use a helmet if you ride a bike or motorcycle.  Use appropriate safety gear for contact sports.  Talk to your caregiver about gun safety.  Use sunscreen with a SPF (or skin protection factor) of 15 or greater.  Lighter skinned people are at a greater risk of skin cancer. Don't forget to also wear sunglasses in order to protect your eyes from too much damaging sunlight. Damaging sunlight can accelerate cataract formation.   Practice safe sex. Use condoms. Condoms are used for birth control and to help reduce the  spread of sexually transmitted infections (or STIs).  Some of the STIs are gonorrhea (the clap), chlamydia, syphilis, trichomonas, herpes, HPV (human papilloma virus) and HIV (human immunodeficiency virus) which causes AIDS. The herpes, HIV and HPV are viral illnesses that have no cure. These can result in disability, cancer and death.   Keep carbon monoxide and smoke detectors in your home functioning at all times. Change the batteries every 6 months or use a model that plugs into the wall.   Vaccinations:  Stay up to date with your tetanus shots and other required immunizations. You should have a booster for tetanus every 10 years. Be sure to get your flu shot every year, since 5%-20% of the [place] population comes down with the flu. The flu vaccine changes each year, so being vaccinated once is not enough. Get your shot in the fall, before the flu season peaks.   Other  vaccines to consider:  Human Papilloma Virus or HPV causes cancer of the cervix, and other infections that can be transmitted from person to person. There is a vaccine for HPV, and females should get immunized between the ages of 81 and 50. It requires a series of 3 shots.   Pneumococcal vaccine to protect against certain types of pneumonia.  This is normally recommended for adults age 1 or older.  However, adults younger than 43 years old with certain underlying conditions such as diabetes, heart or lung disease should also receive the vaccine.  Shingles vaccine to protect against Varicella Zoster if you are older than age 35, or younger than 43 years old with certain underlying illness.  Hepatitis A vaccine to protect against a form of infection of the liver by a virus acquired from food.  Hepatitis B vaccine to protect against a form of infection of the liver by a virus acquired from blood or body fluids, particularly if you work in health care.  If you plan to travel internationally, check with your local health department for specific vaccination recommendations.  Cancer Screening:  Breast cancer screening is essential to preventive care for women. All women age 59 and older should perform a breast self-exam every month. At age 3 and older, women should have their caregiver complete a breast exam each year. Women at ages 67 and older should have a mammogram (x-ray film) of the breasts. Your caregiver can discuss how often you need mammograms.    Cervical cancer screening includes taking a Pap smear (sample of cells examined under a microscope) from the cervix (end of the uterus). It also includes testing for HPV (Human Papilloma Virus, which can cause cervical cancer). Screening and a pelvic exam should begin at age 58, or 3 years after a woman becomes sexually active. Screening should occur every year, with a Pap smear but no HPV testing, up to age 70. After age 63, you should have a Pap  smear every 3 years with HPV testing, if no HPV was found previously.   Most routine colon cancer screening begins at the age of 98. On a yearly basis, doctors may provide special easy to use take-home tests to check for hidden blood in the stool. Sigmoidoscopy or colonoscopy can detect the earliest forms of colon cancer and is life saving. These tests use a small camera at the end of a tube to directly examine the colon. Speak to your caregiver about this at age 64, when routine screening begins (and is repeated every 5 years unless early forms of pre-cancerous polyps or  small growths are found).

## 2017-09-06 ENCOUNTER — Telehealth: Payer: Self-pay | Admitting: Family Medicine

## 2017-09-06 NOTE — Telephone Encounter (Signed)
abstracted 

## 2017-09-06 NOTE — Telephone Encounter (Signed)
Received requested vaccine info from [facility]. Sending to [place].

## 2017-09-26 ENCOUNTER — Other Ambulatory Visit: Payer: Managed Care, Other (non HMO)

## 2017-10-01 ENCOUNTER — Ambulatory Visit
Admission: RE | Admit: 2017-10-01 | Discharge: 2017-10-01 | Disposition: A | Discharge status: Home / Self Care | Payer: Managed Care, Other (non HMO) | Source: Ambulatory Visit | Attending: Family Medicine | Admitting: Family Medicine

## 2017-10-01 DIAGNOSIS — Z1231 Encounter for screening mammogram for malignant neoplasm of breast: Secondary | ICD-10-CM

## 2017-10-28 ENCOUNTER — Other Ambulatory Visit: Payer: Self-pay | Admitting: Family Medicine

## 2017-11-29 ENCOUNTER — Ambulatory Visit (INDEPENDENT_AMBULATORY_CARE_PROVIDER_SITE_OTHER): Payer: Managed Care, Other (non HMO) | Admitting: Obstetrics and Gynecology

## 2017-11-29 ENCOUNTER — Encounter: Payer: Self-pay | Admitting: Obstetrics and Gynecology

## 2017-11-29 VITALS — BP 134/83 | HR 65 | Wt 194.6 lb

## 2017-11-29 DIAGNOSIS — Z01419 Encounter for gynecological examination (general) (routine) without abnormal findings: Secondary | ICD-10-CM

## 2017-11-29 DIAGNOSIS — Z124 Encounter for screening for malignant neoplasm of cervix: Secondary | ICD-10-CM | POA: Diagnosis not present

## 2017-11-29 DIAGNOSIS — Z1151 Encounter for screening for human papillomavirus (HPV): Secondary | ICD-10-CM

## 2017-11-29 NOTE — Progress Notes (Signed)
Pt is 42 yo G2P1 here for annual gyn exam. MMG one month ago, normal per pt. Pap due today, last one 08/2016 normal. Pt has IUD Mirena placed on 08/28/16. Pt denies any questions or concerns.

## 2017-11-29 NOTE — Progress Notes (Signed)
Subjective:     Joanne Hughes is a 42 y.o. female with BMI 31 and LMP 7/4 who is here for a comprehensive physical exam. The patient reports no problems. Patient is sexually active using IUD for contraception. Patient reports a monthly period lasting 5 days. She denies any pelvic pain or abnormal discharge  Past Medical History:  Diagnosis Date  . Hidradenitis   . Hirsutism    tried spironolactone and vaniqa in past without success  . History of elevated lipids   . History of prediabetes    A1c 5.9% January 2017  . Hypothyroid   . IUD contraception   . MRSA (methicillin resistant Staphylococcus aureus)    per medical record from [place]  . PCOS (polycystic ovarian syndrome)    Past Surgical History:  Procedure Laterality Date  . BREAST REDUCTION SURGERY  05/1997  . CESAREAN SECTION    . CHOLECYSTECTOMY    . gastric sleeve surgery  08/2015   surgeon Dr. Roanna Raider of [place]  . REDUCTION MAMMAPLASTY Bilateral   . THYROIDECTOMY  06/04/2013   surgeon Dr. Haywood Pao of [place]   Family History  Problem Relation Age of Onset  . Hypertension Mother   . Hypothyroidism Mother   . Hypertension Father   . Stroke Father   . Diabetes Sister   . Leukemia Sister   . Breast cancer Cousin     Social History   Socioeconomic History  . Marital status: Married    Spouse name: Not on file  . Number of children: Not on file  . Years of education: Not on file  . Highest education level: Not on file  Occupational History  . Not on file  Social Needs  . Financial resource strain: Not on file  . Food insecurity:    Worry: Not on file    Inability: Not on file  . Transportation needs:    Medical: Not on file    Non-medical: Not on file  Tobacco Use  . Smoking status: Never Smoker  . Smokeless tobacco: Never Used  Substance and Sexual Activity  . Alcohol use: Yes    Alcohol/week: 1.2 oz    Types: 2 Glasses of wine per week  . Drug use: No  . Sexual activity: Yes    Birth  control/protection: IUD  Lifestyle  . Physical activity:    Days per week: Not on file    Minutes per session: Not on file  . Stress: Not on file  Relationships  . Social connections:    Talks on phone: Not on file    Gets together: Not on file    Attends religious service: Not on file    Active member of club or organization: Not on file    Attends meetings of clubs or organizations: Not on file    Relationship status: Not on file  . Intimate partner violence:    Fear of current or ex partner: Not on file    Emotionally abused: Not on file    Physically abused: Not on file    Forced sexual activity: Not on file  Other Topics Concern  . Not on file  Social History Narrative  . Not on file   Health Maintenance  Topic Date Due  . HIV Screening  10/15/1989  . TETANUS/TDAP  05/22/2017  . INFLUENZA VACCINE  12/20/2017  . PAP SMEAR  08/29/2019       Review of Systems Pertinent items are noted in HPI.   Objective:  Blood pressure 134/83, pulse 65, weight 194 lb 9.6 oz (88.3 kg), last menstrual period 11/22/2017.     GENERAL: Well-developed, well-nourished female in no acute distress.  HEENT: Normocephalic, atraumatic. Sclerae anicteric.  NECK: Supple. Normal thyroid.  LUNGS: Clear to auscultation bilaterally.  HEART: Regular rate and rhythm. BREASTS: Symmetric in size. No palpable masses or lymphadenopathy, skin changes, or nipple drainage. ABDOMEN: Soft, nontender, nondistended. No organomegaly. PELVIC: Normal external female genitalia. Vagina is pink and rugated.  Normal discharge. Normal appearing cervix with IUD strings visualized at the os. Uterus is normal in size. No adnexal mass or tenderness. EXTREMITIES: No cyanosis, clubbing, or edema, 2+ distal pulses.    Assessment:    Healthy female exam.      Plan:    pap smear collected Patient with normal mammogram 09/2017 Patient will be contacted with abnormal results RTC prn See After Visit Summary for  Counseling Recommendations

## 2017-11-30 LAB — CYTOLOGY - PAP
Diagnosis: NEGATIVE
HPV: NOT DETECTED

## 2018-02-20 ENCOUNTER — Other Ambulatory Visit: Payer: Self-pay | Admitting: Family Medicine

## 2018-02-20 DIAGNOSIS — L02211 Cutaneous abscess of abdominal wall: Secondary | ICD-10-CM

## 2018-02-20 MED ORDER — MUPIROCIN 2 % EX OINT: 1.0000 "application " | TOPICAL_OINTMENT | Freq: Two times a day (BID) | CUTANEOUS | 0 refills | Status: DC

## 2018-02-20 MED ORDER — LEVOTHYROXINE SODIUM 137 MCG PO TABS: ORAL_TABLET | ORAL | 0 refills | Status: DC

## 2018-02-20 NOTE — Telephone Encounter (Signed)
Ok

## 2018-02-20 NOTE — Telephone Encounter (Signed)
Is this okay to refill on bactroban?

## 2018-02-22 ENCOUNTER — Encounter: Payer: Self-pay | Admitting: Family Medicine

## 2018-02-22 ENCOUNTER — Ambulatory Visit: Payer: Managed Care, Other (non HMO) | Admitting: Family Medicine

## 2018-02-22 VITALS — BP 128/80 | HR 80 | Temp 98.0°F | Resp 16 | Wt 192.2 lb

## 2018-02-22 DIAGNOSIS — Z202 Contact with and (suspected) exposure to infections with a predominantly sexual mode of transmission: Secondary | ICD-10-CM | POA: Diagnosis not present

## 2018-02-22 NOTE — Progress Notes (Signed)
   Subjective:    Patient ID: Joanne Hughes, female    DOB: 1976/02/18, 42 y.o.   MRN: [id number]  HPI Chief Complaint  Patient presents with  . blood test    blood test- husband tested positive for herpes. hair bump a couple days ago.    She is here requesting testing for herpes. States her husband tested positive for an acute herpes infection last week. He has been having outbreaks on his penis. She denies ever having a cold sore or genital lesion. Denies any vaginal discharge or sores.   Denies fever, chills,  chest pain, palpitations, shortness of breath, abdominal pain, N/V/D, urinary symptoms.   Reviewed allergies, medications, past medical, surgical, family, and social history.     Review of Systems Pertinent positives and negatives in the history of present illness.     Objective:   Physical Exam BP 128/80   Pulse 80   Temp 98 F (36.7 C) (Oral)   Resp 16   Wt 192 lb 3.2 oz (87.2 kg)   SpO2 98%   BMI 31.02 kg/m   Alert and oriented and in no acute distress. Not otherwise examined. Declines GU exam.      Assessment & Plan:  Potential exposure to STD  Discussed that since she has not had any outbreaks and no history of herpes so doing blood work today for HSV is really not beneficial.  Discussed avoiding contact when her husband has an outbreak.  Also discussed the small risk of contracting the virus when he does not have an outbreak as well.  She is asymptomatic.  No labs or tests were done.  She will follow-up as needed.  She is happy with the plan of care

## 2018-07-01 ENCOUNTER — Ambulatory Visit: Payer: Managed Care, Other (non HMO) | Admitting: Family Medicine

## 2018-07-01 ENCOUNTER — Encounter: Payer: Self-pay | Admitting: Family Medicine

## 2018-07-01 VITALS — BP 130/80 | HR 61 | Temp 97.8°F | Resp 16 | Wt 196.6 lb

## 2018-07-01 DIAGNOSIS — M62838 Other muscle spasm: Secondary | ICD-10-CM

## 2018-07-01 MED ORDER — IBUPROFEN 800 MG PO TABS: 800.0000 mg | ORAL_TABLET | Freq: Three times a day (TID) | ORAL | 0 refills | Status: DC | PRN

## 2018-07-01 MED ORDER — METHOCARBAMOL 500 MG PO TABS: 500.0000 mg | ORAL_TABLET | Freq: Three times a day (TID) | ORAL | 0 refills | Status: DC | PRN

## 2018-07-01 NOTE — Progress Notes (Signed)
   Subjective:    Patient ID: Joanne Hughes, female    DOB: 08-20-1975, 43 y.o.   MRN: [id number]  HPI Chief Complaint  Patient presents with  . neck pain    neck pain- right side radiating shoulder blade, spasms and tightness, friday- had headaches and nauseated, heating pad, ibuprofen, spasms left thigh.    She is here with complaints of right lateral neck and upper back spasm that was preceded by a 4 day history of left upper back pain that has resolved.  She was working around the house and lifting something heavy but denies injury.   Denies radicular symptoms. No weakness of upper extremities.   Treating symptoms at home with hot bath, heating pad, ibuprofen and massage.   States she took a Ambulance person yesterday and this helped.   Denies fever, chills, dizziness, chest pain, palpitations, shortness of breath, abdominal pain, N/V/D, urinary symptoms.     LMP: last week   Review of Systems Pertinent positives and negatives in the history of present illness.     Objective:   Physical Exam Constitutional:      General: She is not in acute distress.    Appearance: Normal appearance.  Neck:     Musculoskeletal: Decreased range of motion. Torticollis and muscular tenderness present.     Comments: Right upper trapezius spasm with TTP Cardiovascular:     Rate and Rhythm: Normal rate and regular rhythm.     Pulses: Normal pulses.  Pulmonary:     Effort: Pulmonary effort is normal.     Breath sounds: Normal breath sounds.  Skin:    General: Skin is warm and dry.     Findings: No rash.  Neurological:     General: No focal deficit present.     Mental Status: She is alert and oriented to person, place, and time.     Cranial Nerves: Cranial nerves are intact.     Sensory: Sensation is intact.     Motor: Motor function is intact.     Gait: Gait is intact.    BP 130/80   Pulse 61   Temp 97.8 F (36.6 C) (Oral)   Resp 16   Wt 196 lb 9.6 oz (89.2 kg)   SpO2 98%   BMI 31.73  kg/m        Assessment & Plan:  Neck muscle spasm - Plan: ibuprofen (ADVIL,MOTRIN) 800 MG tablet, methocarbamol (ROBAXIN) 500 MG tablet  No red flags or neurological deficits. Ibuprofen 800 mg 3 times per day and Robaxin as needed.  Discussed the potential for sedation.  She may use heat or ice as well as a topical analgesic such as Biofreeze.  Follow-up if symptoms are worsening or not improving.

## 2018-07-01 NOTE — Patient Instructions (Signed)
Take 800 mg ibuprofen tablets every 8 hours with food.  Drink a full glass of water with these. Use the muscle relaxant as needed but please be aware that this can be sedating.  Use heat or ice to the area 3-4 times per day.  You may also want to try a topical pain medication such as Biofreeze.

## 2018-08-01 ENCOUNTER — Other Ambulatory Visit: Payer: Self-pay | Admitting: Family Medicine

## 2018-08-01 MED ORDER — KETOCONAZOLE 2 % EX SHAM: 1.0000 "application " | MEDICATED_SHAMPOO | CUTANEOUS | 0 refills | Status: AC

## 2018-08-01 NOTE — Telephone Encounter (Signed)
Pt has an appt on 4/21

## 2018-09-10 ENCOUNTER — Encounter: Payer: Managed Care, Other (non HMO) | Admitting: Family Medicine

## 2018-09-11 ENCOUNTER — Encounter: Payer: Managed Care, Other (non HMO) | Admitting: Family Medicine

## 2018-10-07 ENCOUNTER — Ambulatory Visit: Payer: Managed Care, Other (non HMO) | Admitting: Family Medicine

## 2018-10-07 ENCOUNTER — Encounter: Payer: Self-pay | Admitting: Family Medicine

## 2018-10-07 ENCOUNTER — Other Ambulatory Visit: Payer: Self-pay

## 2018-10-07 VITALS — Wt 193.0 lb

## 2018-10-07 DIAGNOSIS — Z789 Other specified health status: Secondary | ICD-10-CM | POA: Diagnosis not present

## 2018-10-07 DIAGNOSIS — R198 Other specified symptoms and signs involving the digestive system and abdomen: Secondary | ICD-10-CM | POA: Diagnosis not present

## 2018-10-07 NOTE — Progress Notes (Signed)
   Subjective:   Documentation for virtual audio and video telecommunications through [place] encounter:  The patient was located at home. 2 patient identifiers were used. The provider was located in the office. The patient did consent to this visit and is aware of possible charges through their insurance for this visit.  The other persons participating in this telemedicine service were none.     Patient ID: Joanne Hughes, female    DOB: 1975/06/29, 43 y.o.   MRN: [id number]  HPI Chief Complaint  Patient presents with  . blister    blisters on tongue noticied it last night, not painful but can tell that something is there. feels weird   She is concerned today due to looking at her tongue and seeing what she thought were abnormal bumps on the back of her tongue. Denies any issues with taste or any other abnormal findings on her tongue or in her mouth.  Denies fever, chills, postnasal drainage, sore throat  States she was recently tested for COVID-19 and her result was negative.   Review of Systems Pertinent positives and negatives in the history of present illness.     Objective:   Physical Exam Wt 193 lb (87.5 kg)   BMI 31.15 kg/m   Alert and oriented and in no acute distress.  Normal oropharyngeal cavity.  The area of concern appears to be normal papillae on the back of her tongue.      Assessment & Plan:  Tongue symptom  Normal appearance of tissue  Discussed limitations of a virtual visit.  I did review pictures sent via my chart portal. She is in her usual state of health. Discussed that the area that she was concerned about appears to be normal papillae.  I also had Dr. Redmond School reviewed the pictures and he agrees. No further evaluation needed.  Time spent on call was 8 minutes and in review of previous records 1 minutes total.  This virtual service is not related to other E/M service within previous 7 days.

## 2018-10-13 ENCOUNTER — Other Ambulatory Visit: Payer: Self-pay | Admitting: Family Medicine

## 2018-10-30 ENCOUNTER — Encounter: Payer: Self-pay | Admitting: Family Medicine

## 2018-10-30 DIAGNOSIS — E7800 Pure hypercholesterolemia, unspecified: Secondary | ICD-10-CM | POA: Insufficient documentation

## 2018-10-30 HISTORY — DX: Pure hypercholesterolemia, unspecified: E78.00

## 2018-10-30 NOTE — Patient Instructions (Addendum)
It was a pleasure seeing you today. Make sure you are not skipping meals and that you are having some protein with each meal.  Stay well-hydrated. When you have the episodes of fatigue and shakiness, I recommend checking your blood sugar. If you are seeing a blood sugar less than 80 then this is abnormal.  We will call you with your lab results and adjust medications as needed.  For now continue your current medications   Preventive Care 40-64 Years, Female Preventive care refers to lifestyle choices and visits with your health care provider that can promote health and wellness. hat does preventive care include?   A yearly physical exam. This is also called an annual well check.  Dental exams once or twice a year.  Routine eye exams. Ask your health care provider how often you should have your eyes checked.  Personal lifestyle choices, including: ? Daily care of your teeth and gums. ? Regular physical activity. ? Eating a healthy diet. ? Avoiding tobacco and drug use. ? Limiting alcohol use. ? Practicing safe sex. ? Taking low-dose aspirin daily starting at age 7. ? Taking vitamin and mineral supplements as recommended by your health care provider. What happens during an annual well check? The services and screenings done by your health care provider during your annual well check will depend on your age, overall health, lifestyle risk factors, and family history of disease. Counseling Your health care provider may ask you questions about your:  Alcohol use.  Tobacco use.  Drug use.  Emotional well-being.  Home and relationship well-being.  Sexual activity.  Eating habits.  Work and work Statistician.  Method of birth control.  Menstrual cycle.  Pregnancy history. Screening You may have the following tests or measurements:  Height, weight, and BMI.  Blood pressure.  Lipid and cholesterol levels. These may be checked every 5 years, or more frequently if you  are over 24 years old.  Skin check.  Lung cancer screening. You may have this screening every year starting at age 38 if you have a 30-pack-year history of smoking and currently smoke or have quit within the past 15 years.  Colorectal cancer screening. All adults should have this screening starting at age 73 and continuing until age 55. Your health care provider may recommend screening at age 43. You will have tests every 1-10 years, depending on your results and the type of screening test. People at increased risk should start screening at an earlier age. Screening tests may include: ? Guaiac-based fecal occult blood testing. ? Fecal immunochemical test (FIT). ? Stool DNA test. ? Virtual colonoscopy. ? Sigmoidoscopy. During this test, a flexible tube with a tiny camera (sigmoidoscope) is used to examine your rectum and lower colon. The sigmoidoscope is inserted through your anus into your rectum and lower colon. ? Colonoscopy. During this test, a long, thin, flexible tube with a tiny camera (colonoscope) is used to examine your entire colon and rectum.  Hepatitis C blood test.  Hepatitis B blood test.  Sexually transmitted disease (STD) testing.  Diabetes screening. This is done by checking your blood sugar (glucose) after you have not eaten for a while (fasting). You may have this done every 1-3 years.  Mammogram. This may be done every 1-2 years. Talk to your health care provider about when you should start having regular mammograms. This may depend on whether you have a family history of breast cancer.  BRCA-related cancer screening. This may be done if you have a  family history of breast, ovarian, tubal, or peritoneal cancers.  Pelvic exam and Pap test. This may be done every 3 years starting at age 42. Starting at age 65, this may be done every 5 years if you have a Pap test in combination with an HPV test.  Bone density scan. This is done to screen for osteoporosis. You may have  this scan if you are at high risk for osteoporosis. Discuss your test results, treatment options, and if necessary, the need for more tests with your health care provider. Vaccines Your health care provider may recommend certain vaccines, such as:  Influenza vaccine. This is recommended every year.  Tetanus, diphtheria, and acellular pertussis (Tdap, Td) vaccine. You may need a Td booster every 10 years.  Varicella vaccine. You may need this if you have not been vaccinated.  Zoster vaccine. You may need this after age 30.  Measles, mumps, and rubella (MMR) vaccine. You may need at least one dose of MMR if you were born in 1957 or later. You may also need a second dose.  Pneumococcal 13-valent conjugate (PCV13) vaccine. You may need this if you have certain conditions and were not previously vaccinated.  Pneumococcal polysaccharide (PPSV23) vaccine. You may need one or two doses if you smoke cigarettes or if you have certain conditions.  Meningococcal vaccine. You may need this if you have certain conditions.  Hepatitis A vaccine. You may need this if you have certain conditions or if you travel or work in places where you may be exposed to hepatitis A.  Hepatitis B vaccine. You may need this if you have certain conditions or if you travel or work in places where you may be exposed to hepatitis B.  Haemophilus influenzae type b (Hib) vaccine. You may need this if you have certain conditions. Talk to your health care provider about which screenings and vaccines you need and how often you need them. This information is not intended to replace advice given to you by your health care provider. Make sure you discuss any questions you have with your health care provider. Document Released: 06/04/2015 Document Revised: 06/28/2017 Document Reviewed: 03/09/2015 Elsevier Interactive Patient Education  2019 [employer].

## 2018-10-30 NOTE — Progress Notes (Signed)
Subjective:    Patient ID: Joanne Hughes, female    DOB: 15-Dec-1975, 43 y.o.   MRN: [id number]  HPI Chief Complaint  Patient presents with  . fasting cpe    fasting cpe, gets eyes checked. 2 weeks ago- fatigue, and shakey- last about 10 mins and then goes away. doesnt happen often.    She is here for a complete physical exam.  She also complains of intermittent episodes of feeling severely fatigued and shaky for approximately 10 minutes.  This is been happening for several months.  States after eating or drinking something her symptoms improved.  She has a history of prediabetes.  Has not checked her blood sugar to see if it is low when these episodes occur.  She does admit to skipping meals especially breakfast.  Overall her energy level is fine.  Other providers: OB/GYN-  Dr. Elly Modena Orthopedist- Dr. Ninfa Linden  Dermatologist- she cannot recall  Dentist- cannot recall   Past medical history: MRSA Surgeries: gastric sleeve    Social history: Lives with husband and child, works for [employer]  Denies smoking, drinking alcohol, drug use  Diet: fairly healthy but skips meals sometimes.  Excerise: walking but not as often   Immunizations: Tdap not on file. 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   Reviewed allergies, medications, past medical, surgical, family, and social history.     Review of Systems Review of Systems Constitutional: -fever, -chills, -sweats, -unexpected weight change,+ intermittent episodes of fatigue ENT: -runny nose, -ear pain, -sore throat Cardiology:  -chest pain, -palpitations, -edema Respiratory: -cough, -shortness of  breath, -wheezing Gastroenterology: -abdominal pain, -nausea, -vomiting, -diarrhea, -constipation  Hematology: -bleeding or bruising problems Musculoskeletal: -arthralgias, -myalgias, -joint swelling, -back pain Ophthalmology: -vision changes Urology: -dysuria, -difficulty urinating, -hematuria, -urinary frequency, -urgency Neurology: -headache, -weakness, -tingling, -numbness       Objective:   Physical Exam BP 124/74   Pulse 80   Temp 97.7 F (36.5 C) (Oral)   Ht 5' 5.5" (1.664 m)   Wt 194 lb 6.4 oz (88.2 kg)   LMP 10/04/2018   SpO2 99%   BMI 31.86 kg/m   General Appearance:    Alert, cooperative, no distress, appears stated age  Head:    Normocephalic, without obvious abnormality, atraumatic  Eyes:    PERRL, conjunctiva/corneas clear, EOM's intact, fundi    benign  Ears:    Normal TM's and external ear canals  Nose:  Mask in place due to coronavirus pandemic  Throat:  Mask in place due to coronavirus pandemic  Neck:   Supple, no lymphadenopathy;  thyroid:  Not palpable- removed. no carotid   bruit or JVD  Back:    Spine nontender, no curvature, ROM normal, no CVA     tenderness  Lungs:     Clear to auscultation bilaterally without wheezes, rales or     ronchi; respirations unlabored  Chest Wall:    No tenderness or deformity   Heart:    Regular rate and rhythm, S1 and S2 normal, no murmur, rub   or gallop  Breast Exam:   OB/GYN  Abdomen:     Soft, non-tender, nondistended, normoactive bowel sounds,  no masses, no hepatosplenomegaly  Genitalia:   OB/GYN     Extremities:   No clubbing, cyanosis or edema  Pulses:   2+ and symmetric all extremities  Skin:   Skin color, texture, turgor normal, no rashes or lesions  Lymph nodes:   Cervical, supraclavicular, and axillary nodes normal  Neurologic:   CNII-XII intact, normal strength, sensation and gait; reflexes 2+ and symmetric throughout          Psych:   Normal mood, affect, hygiene and grooming.         Assessment  & Plan:  Routine general medical examination at a health care facility - Plan: CBC with Differential/Platelet, Comprehensive metabolic panel, TSH, T4, free, Lipid panel-she appears to be doing well physically and emotionally.  She sees her OB/GYN and is up-to-date on her Pap smear.  She is due for her mammogram and will call to schedule this.  Immunizations reviewed.  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  I will request a copy of this result.  Discussed safety and health promotion.  Counseling on healthy diet and physical activity.  History of elevated lipids - Plan: Discussed low-cholesterol and low-fat diet as well as increasing physical activity to help this.  History of prediabetes - Plan: Hemoglobin A1c, counseling on diet and exercise  Elevated LDL cholesterol level - Plan: Lipid panel,   Postoperative hypothyroidism - Plan: Continue on current dose of levothyroxine and check thyroid function.  Adjust dose as needed.  Other fatigue - Plan: CBC with Differential/Platelet, TSH, T4, free, VITAMIN D 25 Hydroxy (Vit-D Deficiency, Fractures), Vitamin B12, Hemoglobin A1c-she reports intermittent episodes of fatigue lasting approximately 10 minutes.  She also feels jittery.  States after eating or drinking something her symptoms resolve.  Discussed that this could be a blood sugar issue and I recommend that she stop skipping meals and make sure she is getting adequate protein in her diet.  She will also check her blood sugar the next time this occurs to see if it is low.  I will check labs in follow-up

## 2018-10-31 ENCOUNTER — Ambulatory Visit: Payer: Managed Care, Other (non HMO) | Admitting: Family Medicine

## 2018-10-31 ENCOUNTER — Encounter: Payer: Self-pay | Admitting: Family Medicine

## 2018-10-31 ENCOUNTER — Other Ambulatory Visit: Payer: Self-pay

## 2018-10-31 VITALS — BP 124/74 | HR 80 | Temp 97.7°F | Ht 65.5 in | Wt 194.4 lb

## 2018-10-31 DIAGNOSIS — Z8639 Personal history of other endocrine, nutritional and metabolic disease: Secondary | ICD-10-CM | POA: Diagnosis not present

## 2018-10-31 DIAGNOSIS — Z87898 Personal history of other specified conditions: Secondary | ICD-10-CM

## 2018-10-31 DIAGNOSIS — E890 Postprocedural hypothyroidism: Secondary | ICD-10-CM

## 2018-10-31 DIAGNOSIS — Z0000 Encounter for general adult medical examination without abnormal findings: Secondary | ICD-10-CM

## 2018-10-31 DIAGNOSIS — R5383 Other fatigue: Secondary | ICD-10-CM

## 2018-10-31 LAB — LIPID PANEL

## 2018-11-01 LAB — COMPREHENSIVE METABOLIC PANEL
ALT: 9 IU/L (ref 0–32)
AST: 13 IU/L (ref 0–40)
Albumin/Globulin Ratio: 1.8 (ref 1.2–2.2)
Albumin: 4.2 g/dL (ref 3.8–4.8)
Alkaline Phosphatase: 58 IU/L (ref 39–117)
BUN/Creatinine Ratio: 14 (ref 9–23)
BUN: 10 mg/dL (ref 6–24)
Bilirubin Total: 0.8 mg/dL (ref 0.0–1.2)
CO2: 21 mmol/L (ref 20–29)
Calcium: 9 mg/dL (ref 8.7–10.2)
Chloride: 106 mmol/L (ref 96–106)
Creatinine, Ser: 0.71 mg/dL (ref 0.57–1.00)
GFR calc Af Amer: 120 mL/min/{1.73_m2} (ref >59)
GFR calc non Af Amer: 104 mL/min/{1.73_m2} (ref >59)
Globulin, Total: 2.4 g/dL (ref 1.5–4.5)
Glucose: 89 mg/dL (ref 65–99)
Potassium: 4.5 mmol/L (ref 3.5–5.2)
Sodium: 141 mmol/L (ref 134–144)
Total Protein: 6.6 g/dL (ref 6.0–8.5)

## 2018-11-01 LAB — CBC WITH DIFFERENTIAL/PLATELET
Basophils Absolute: 0 10*3/uL (ref 0.0–0.2)
Basos: 0 %
EOS (ABSOLUTE): 0.1 10*3/uL (ref 0.0–0.4)
Eos: 1 %
Hematocrit: 35.6 % (ref 34.0–46.6)
Hemoglobin: 11.2 g/dL (ref 11.1–15.9)
Immature Grans (Abs): 0 10*3/uL (ref 0.0–0.1)
Immature Granulocytes: 0 %
Lymphocytes Absolute: 1.9 10*3/uL (ref 0.7–3.1)
Lymphs: 22 %
MCH: 27.4 pg (ref 26.6–33.0)
MCHC: 31.5 g/dL (ref 31.5–35.7)
MCV: 87 fL (ref 79–97)
Monocytes Absolute: 0.5 10*3/uL (ref 0.1–0.9)
Monocytes: 6 %
Neutrophils Absolute: 5.9 10*3/uL (ref 1.4–7.0)
Neutrophils: 71 %
Platelets: 286 10*3/uL (ref 150–450)
RBC: 4.09 x10E6/uL (ref 3.77–5.28)
RDW: 14.6 % (ref 11.7–15.4)
WBC: 8.4 10*3/uL (ref 3.4–10.8)

## 2018-11-01 LAB — LIPID PANEL
Chol/HDL Ratio: 2.6 ratio (ref 0.0–4.4)
Cholesterol, Total: 178 mg/dL (ref 100–199)
HDL: 69 mg/dL (ref >39)
LDL Calculated: 98 mg/dL (ref 0–99)
Triglycerides: 54 mg/dL (ref 0–149)
VLDL Cholesterol Cal: 11 mg/dL (ref 5–40)

## 2018-11-01 LAB — T4, FREE: Free T4: 1.41 ng/dL (ref 0.82–1.77)

## 2018-11-01 LAB — VITAMIN B12: Vitamin B-12: 525 pg/mL (ref 232–1245)

## 2018-11-01 LAB — HEMOGLOBIN A1C
Est. average glucose Bld gHb Est-mCnc: 114 mg/dL
Hgb A1c MFr Bld: 5.6 % (ref 4.8–5.6)

## 2018-11-01 LAB — TSH: TSH: 1.84 u[IU]/mL (ref 0.450–4.500)

## 2018-11-01 LAB — VITAMIN D 25 HYDROXY (VIT D DEFICIENCY, FRACTURES): Vit D, 25-Hydroxy: 25.1 ng/mL — ABNORMAL LOW (ref 30.0–100.0)

## 2018-11-03 ENCOUNTER — Encounter: Payer: Self-pay | Admitting: Family Medicine

## 2018-11-03 DIAGNOSIS — E559 Vitamin D deficiency, unspecified: Secondary | ICD-10-CM

## 2018-11-03 HISTORY — DX: Vitamin D deficiency, unspecified: E55.9

## 2018-11-23 ENCOUNTER — Other Ambulatory Visit: Payer: Self-pay | Admitting: Family Medicine

## 2018-11-27 ENCOUNTER — Telehealth: Payer: Self-pay | Admitting: Family Medicine

## 2018-11-27 NOTE — Telephone Encounter (Signed)
Received requested info received from [facility]

## 2019-03-31 ENCOUNTER — Encounter: Payer: Self-pay | Admitting: Family Medicine

## 2019-04-02 ENCOUNTER — Encounter: Payer: Self-pay | Admitting: Family Medicine

## 2019-04-02 ENCOUNTER — Other Ambulatory Visit: Payer: Self-pay

## 2019-04-02 ENCOUNTER — Ambulatory Visit: Payer: Managed Care, Other (non HMO) | Admitting: Family Medicine

## 2019-04-02 VITALS — BP 128/83 | Wt 190.0 lb

## 2019-04-02 DIAGNOSIS — F329 Major depressive disorder, single episode, unspecified: Secondary | ICD-10-CM | POA: Diagnosis not present

## 2019-04-02 DIAGNOSIS — F32A Depression, unspecified: Secondary | ICD-10-CM

## 2019-04-02 DIAGNOSIS — F419 Anxiety disorder, unspecified: Secondary | ICD-10-CM | POA: Diagnosis not present

## 2019-04-02 DIAGNOSIS — F439 Reaction to severe stress, unspecified: Secondary | ICD-10-CM | POA: Diagnosis not present

## 2019-04-02 MED ORDER — SERTRALINE HCL 50 MG PO TABS: 50.0000 mg | ORAL_TABLET | Freq: Every day | ORAL | 1 refills | Status: DC

## 2019-04-02 NOTE — Progress Notes (Signed)
   Subjective:  Documentation for virtual audio and video telecommunications through [place] encounter:  The patient was located at home. 2 patient identifiers used.  The provider was located in the office. The patient did consent to this visit and is aware of possible charges through their insurance for this visit.  The other persons participating in this telemedicine service were none.    Patient ID: Joanne Hughes, female    DOB: 04-Mar-1976, 43 y.o.   MRN: [id number]  HPI Chief Complaint  Patient presents with  . stress and aniexty    stress and aneixty x 6-8 weeks   Complains of severe stress and anxiety for the past 2 months without a history of the same. She has a new job and having issues with it. States she is working from home and does not like this. 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 She is tearful.   She has a good support network but has not been seeing a therapist.   She thinks she may need to start on medication. Denies history of mental health issues and has not ever taken medication for it.   Depression screen [facility] 2/9 04/02/2019 10/31/2018 09/05/2017  Decreased Interest 1 0 0  Down, Depressed, Hopeless 2 0 0  PHQ - 2 Score 3 0 0  Altered sleeping 3 - -  Tired, decreased energy 2 - -  Change in appetite 1 - -  Feeling bad or failure about yourself  1 - -  Trouble concentrating 3 - -  Moving slowly or fidgety/restless 2 - -  Suicidal thoughts 0 - -  PHQ-9 Score 15 - -  Difficult doing work/chores Somewhat difficult - -   Reviewed allergies, medications, past medical, surgical, family, and social history.   Review of Systems Pertinent positives and negatives in the history of present illness.     Objective:   Physical Exam BP 128/83   Wt 190 lb (86.2 kg)   BMI 31.14 kg/m   Alert and oriented and in no acute distress. Normal speech, thought process. Denies SI.       Assessment & Plan:  Situational stress - Plan:  sertraline (ZOLOFT) 50 MG tablet  Anxiety and depression - Plan: sertraline (ZOLOFT) 50 MG tablet  She denies history of anxiety or depression and believes her current issues are related directly to her new job and having to work from home. She does not want to get involved in counseling but I did strongly encourage it. She would like to start on medication.  Discussed how to take sertraline and potential side effects.  Follow up in 2 weeks virtually or sooner if needed.   Time spent on call was 22 minutes and in review of previous records >25 minutes total.  This virtual service is not related to other E/M service within previous 7 days.

## 2019-04-12 ENCOUNTER — Other Ambulatory Visit: Payer: Self-pay | Admitting: Family Medicine

## 2019-04-28 ENCOUNTER — Other Ambulatory Visit: Payer: Self-pay

## 2019-04-28 ENCOUNTER — Ambulatory Visit (INDEPENDENT_AMBULATORY_CARE_PROVIDER_SITE_OTHER): Payer: Managed Care, Other (non HMO) | Admitting: Family Medicine

## 2019-04-28 ENCOUNTER — Encounter: Payer: Self-pay | Admitting: Family Medicine

## 2019-04-28 VITALS — Wt 186.0 lb

## 2019-04-28 DIAGNOSIS — F329 Major depressive disorder, single episode, unspecified: Secondary | ICD-10-CM | POA: Diagnosis not present

## 2019-04-28 DIAGNOSIS — F419 Anxiety disorder, unspecified: Secondary | ICD-10-CM

## 2019-04-28 DIAGNOSIS — F439 Reaction to severe stress, unspecified: Secondary | ICD-10-CM | POA: Diagnosis not present

## 2019-04-28 DIAGNOSIS — F32A Depression, unspecified: Secondary | ICD-10-CM

## 2019-04-28 NOTE — Progress Notes (Signed)
   Subjective:  Documentation for virtual audio and video telecommunications through [place] encounter:  The patient was located at home. 2 patient identifiers used.  The provider was located in the office. The patient did consent to this visit and is aware of possible charges through their insurance for this visit.  The other persons participating in this telemedicine service were none.    Patient ID: Joanne Hughes, female    DOB: 11/09/1975, 43 y.o.   MRN: [id number]  HPI Chief Complaint  Patient presents with  . Follow-up    f/u on new med   This is a follow up visit on situational stress along with depression and anxiety.   Started on sertaline and is doing well on it. She had some side effects for the first 2 days but feels much improved since and no side effects.   No new concerns today.     Depression screen [facility] 2/9 04/28/2019 04/02/2019 10/31/2018 09/05/2017  Decreased Interest 0 1 0 0  Down, Depressed, Hopeless 0 2 0 0  PHQ - 2 Score 0 3 0 0  Altered sleeping - 3 - -  Tired, decreased energy - 2 - -  Change in appetite - 1 - -  Feeling bad or failure about yourself  - 1 - -  Trouble concentrating - 3 - -  Moving slowly or fidgety/restless - 2 - -  Suicidal thoughts - 0 - -  PHQ-9 Score - 15 - -  Difficult doing work/chores - Somewhat difficult - -     Review of Systems Pertinent positives and negatives in the history of present illness.     Objective:   Physical Exam Wt 186 lb (84.4 kg)   BMI 30.48 kg/m   Alert and oriented and in no acute distress. Normal mood.       Assessment & Plan:  Situational stress  Anxiety and depression  She is doing well on sertaline. Feels much better and able to focus. Would like to stay on this medication through the winter and I encourage this. No new concerns today.   Time spent on call was 10 minutes and in review of previous records 10 minutes total.  This virtual service is not related to other E/M service  within previous 7 days.

## 2019-07-28 ENCOUNTER — Other Ambulatory Visit: Payer: Self-pay | Admitting: Family Medicine

## 2019-07-31 ENCOUNTER — Ambulatory Visit: Payer: Managed Care, Other (non HMO) | Admitting: Family Medicine

## 2019-07-31 ENCOUNTER — Encounter: Payer: Self-pay | Admitting: Family Medicine

## 2019-07-31 ENCOUNTER — Other Ambulatory Visit: Payer: Self-pay

## 2019-07-31 VITALS — Wt 192.0 lb

## 2019-07-31 DIAGNOSIS — F439 Reaction to severe stress, unspecified: Secondary | ICD-10-CM | POA: Diagnosis not present

## 2019-07-31 NOTE — Progress Notes (Signed)
   Subjective:  Documentation for virtual audio and video telecommunications through [place] encounter:  The patient was located at home. 2 patient identifiers used.  The provider was located in the office. The patient did consent to this visit and is aware of possible charges through their insurance for this visit.  The other persons participating in this telemedicine service were none.    Patient ID: Joanne Hughes, female    DOB: Sep 09, 1975, 44 y.o.   MRN: [id number]  HPI Chief Complaint  Patient presents with  . 3 MONTH FOLLOW-UP    3 month follow-up on aniexty and depression. doing much better   This is a virtual follow up on anxiety and depression. States she stopped taking sertraline 4 weeks ago and she is doing well. Did not have side effects but wanted to see how she felt off the medication and she feels good.  More comfortable in her job.  Does not think she needs medication.   No new concerns.    Review of Systems Pertinent positives and negatives in the history of present illness.     Objective:   Physical Exam Wt 192 lb (87.1 kg)   BMI 31.46 kg/m   Alert and oriented and in no acute distress.  Normal mood and thought process.      Assessment & Plan:  Situational stress  She is no longer taking sertraline and is doing well off of the medication.   Follow-up as needed  Time spent on call was 10 minutes and in review of previous records 15 minutes total.  This virtual service is not related to other E/M service within previous 7 days.

## 2019-08-01 ENCOUNTER — Ambulatory Visit: Payer: Managed Care, Other (non HMO) | Admitting: Family Medicine

## 2019-09-24 ENCOUNTER — Encounter: Payer: Self-pay | Admitting: Family Medicine

## 2019-10-08 ENCOUNTER — Other Ambulatory Visit: Payer: Self-pay

## 2019-10-08 ENCOUNTER — Ambulatory Visit: Payer: Managed Care, Other (non HMO) | Admitting: Orthopaedic Surgery

## 2019-10-08 ENCOUNTER — Ambulatory Visit: Payer: Self-pay

## 2019-10-08 VITALS — Ht 65.5 in | Wt 190.0 lb

## 2019-10-08 DIAGNOSIS — G8929 Other chronic pain: Secondary | ICD-10-CM | POA: Diagnosis not present

## 2019-10-08 DIAGNOSIS — M25562 Pain in left knee: Secondary | ICD-10-CM

## 2019-10-08 DIAGNOSIS — M25561 Pain in right knee: Secondary | ICD-10-CM | POA: Diagnosis not present

## 2019-10-08 DIAGNOSIS — M1712 Unilateral primary osteoarthritis, left knee: Secondary | ICD-10-CM | POA: Diagnosis not present

## 2019-10-08 DIAGNOSIS — M1711 Unilateral primary osteoarthritis, right knee: Secondary | ICD-10-CM | POA: Diagnosis not present

## 2019-10-08 MED ORDER — LIDOCAINE HCL 1 % IJ SOLN
3.0000 mL | INTRAMUSCULAR | Status: AC | PRN
  Administered 2019-10-08: 3 mL

## 2019-10-08 MED ORDER — METHYLPREDNISOLONE ACETATE 40 MG/ML IJ SUSP
40.0000 mg | INTRAMUSCULAR | Status: AC | PRN
  Administered 2019-10-08: 40 mg via INTRA_ARTICULAR

## 2019-10-08 NOTE — Progress Notes (Signed)
Office Visit Note   Patient: Joanne Hughes           Date of Birth: 19-Jul-1975           MRN: [id number] Visit Date: 10/08/2019              Requested by: Girtha Rm, NP-C [place],  [place] [address] PCP: Girtha Rm, NP-C   Assessment & Plan: Visit Diagnoses:  1. Chronic pain of left knee   2. Chronic pain of right knee   3. Unilateral primary osteoarthritis, left knee   4. Unilateral primary osteoarthritis, right knee     Plan: She does have significant patellofemoral chondromalacia and arthritis of both knees.  I showed her quad strengthening exercises to try.  I did recommend steroid injections for both knees to temporize the pain from synovitis in the knees but she is definitely a candidate for hyaluronic acid for both knees.  Hopefully we get this approved through insurance because I think this can help her the most.  All question concerns were answered and addressed.  Follow-Up Instructions: Return in about 4 weeks (around 11/05/2019).   Orders:  Orders Placed This Encounter  Procedures  . Large Joint Inj  . Large Joint Inj  . XR Knee 1-2 Views Left  . XR Knee 1-2 Views Right   No orders of the defined types were placed in this encounter.     Procedures: Large Joint Inj: R knee on 10/08/2019 11:07 AM Indications: diagnostic evaluation and pain Details: 22 G 1.5 in needle, superolateral approach  Arthrogram: No  Medications: 3 mL lidocaine 1 %; 40 mg methylPREDNISolone acetate 40 MG/ML Outcome: tolerated well, no immediate complications Procedure, treatment alternatives, risks and benefits explained, specific risks discussed. Consent was given by the patient. Immediately prior to procedure a time out was called to verify the correct patient, procedure, equipment, support staff and site/side marked as required. Patient was prepped and draped in the usual sterile fashion.   Large Joint Inj: L knee on 10/08/2019 11:07 AM Indications:  diagnostic evaluation and pain Details: 22 G 1.5 in needle, superolateral approach  Arthrogram: No  Medications: 3 mL lidocaine 1 %; 40 mg methylPREDNISolone acetate 40 MG/ML Outcome: tolerated well, no immediate complications Procedure, treatment alternatives, risks and benefits explained, specific risks discussed. Consent was given by the patient. Immediately prior to procedure a time out was called to verify the correct patient, procedure, equipment, support staff and site/side marked as required. Patient was prepped and draped in the usual sterile fashion.       Clinical Data: No additional findings.   Subjective: Chief Complaint  Patient presents with  . Left Knee - Pain  . Right Knee - Pain  The patient comes in today for evaluation treatment of bilateral knee pain.  I actually saw her years ago.  She actually used to weigh significantly more and she has lost a lot of weight over the years.  She reports bilateral knee pain with the right worse than left.  It is mainly going up and down stairs.  She gets a lot of popping and grinding with both knees.  She is never had surgery on his knees.  I did inject him with a steroid years ago and this did help some.  She has had no other acute change in medical status.  She works in the [employer] but has been working at home due to [employer] pandemic.  HPI  Review of Systems  She currently denies any headache, chest pain, shortness of breath, fever, chills, nausea, vomiting  Objective: Vital Signs: Ht 5' 5.5" (1.664 m)   Wt 190 lb (86.2 kg)   BMI 31.14 kg/m   Physical Exam She is alert and orient x3 and in no acute Ortho Exam Stress examination of both knees she has significant patellofemoral crepitation more on the right than the left but is quite significant with grinding and popping.  Both knees slightly hyperextend.  Her medial and lateral joint lines are well-maintained on both knees.  There is no malalignment and her range of  motion is full.  Both knees are ligamentously stable. Specialty Comments:  No specialty comments available.  Imaging: XR Knee 1-2 Views Left  Result Date: 10/08/2019 2 views of the left knee show no acute findings other than patellofemoral arthritic changes.  XR Knee 1-2 Views Right  Result Date: 10/08/2019 An AP and lateral of the right knee shows no acute findings.  There is patellofemoral narrowing.    PMFS History: Patient Active Problem List   Diagnosis Date Noted  . Situational stress 04/02/2019  . Anxiety and depression 04/02/2019  . Vitamin D deficiency 11/03/2018  . Elevated LDL cholesterol level 10/30/2018  . Acute pain of left knee 09/12/2016  . History of prediabetes 07/12/2016  . Postoperative hypothyroidism 07/12/2016  . History of PCOS 07/12/2016  . IUD contraception 07/12/2016  . Hirsutism   . Hidradenitis   . MRSA (methicillin resistant Staphylococcus aureus)    Past Medical History:  Diagnosis Date  . Elevated LDL cholesterol level 10/30/2018  . Hidradenitis   . Hirsutism    tried spironolactone and vaniqa in past without success  . History of elevated lipids   . History of prediabetes    A1c 5.9% January 2017  . Hypothyroid   . IUD contraception   . MRSA (methicillin resistant Staphylococcus aureus)    per medical record from [place]  . PCOS (polycystic ovarian syndrome)   . Vitamin D deficiency 11/03/2018    Family History  Problem Relation Age of Onset  . Hypertension Mother   . Hypothyroidism Mother   . Hypertension Father   . Stroke Father   . Diabetes Sister   . Leukemia Sister   . Breast cancer Cousin     Past Surgical History:  Procedure Laterality Date  . BREAST REDUCTION SURGERY  05/1997  . CESAREAN SECTION    . CHOLECYSTECTOMY    . gastric sleeve surgery  08/2015   surgeon Dr. Roanna Raider of [place]  . REDUCTION MAMMAPLASTY Bilateral   . THYROIDECTOMY  06/04/2013   surgeon Dr. Haywood Pao of [place]   Social History    Occupational History  . Not on file  Tobacco Use  . Smoking status: Never Smoker  . Smokeless tobacco: Never Used  Substance and Sexual Activity  . Alcohol use: Yes    Alcohol/week: 2.0 standard drinks    Types: 2 Glasses of wine per week  . Drug use: No  . Sexual activity: Yes    Birth control/protection: I.U.D.

## 2019-10-10 ENCOUNTER — Telehealth: Payer: Self-pay

## 2019-10-10 NOTE — Telephone Encounter (Signed)
Bilateral knee gel injections 

## 2019-10-10 NOTE — Telephone Encounter (Signed)
Submitted VOB for Monovisc, bilateral knee. 

## 2019-10-10 NOTE — Telephone Encounter (Signed)
Noted  

## 2019-10-13 ENCOUNTER — Telehealth: Payer: Self-pay

## 2019-10-13 NOTE — Telephone Encounter (Signed)
PA required for Monovisc,bilateral knee. Faxed completed PA form to [employer] at [phone]. 

## 2019-10-15 ENCOUNTER — Telehealth: Payer: Self-pay

## 2019-10-15 NOTE — Telephone Encounter (Signed)
Approved, Monovisc, Bilateral knee. [place] Once OOP has been met, patient will be covered at 100%. Co-pay of $50.00 PA required PA Approval# [id number] Valid 10/13/2019- 11/10/2019

## 2019-10-29 ENCOUNTER — Encounter: Payer: Self-pay | Admitting: Orthopaedic Surgery

## 2019-10-29 ENCOUNTER — Ambulatory Visit: Payer: Managed Care, Other (non HMO) | Admitting: Orthopaedic Surgery

## 2019-10-29 ENCOUNTER — Other Ambulatory Visit: Payer: Self-pay

## 2019-10-29 DIAGNOSIS — M1712 Unilateral primary osteoarthritis, left knee: Secondary | ICD-10-CM

## 2019-10-29 DIAGNOSIS — M1711 Unilateral primary osteoarthritis, right knee: Secondary | ICD-10-CM

## 2019-10-29 MED ORDER — LIDOCAINE HCL 1 % IJ SOLN
3.0000 mL | INTRAMUSCULAR | Status: AC | PRN
  Administered 2019-10-29: 3 mL

## 2019-10-29 MED ORDER — HYALURONAN 88 MG/4ML IX SOSY
88.0000 mg | PREFILLED_SYRINGE | INTRA_ARTICULAR | Status: AC | PRN
  Administered 2019-10-29: 88 mg via INTRA_ARTICULAR

## 2019-10-29 NOTE — Progress Notes (Signed)
   Procedure Note  Patient: Joanne Hughes             Date of Birth: 05-07-1976           MRN: [id number]             Visit Date: 10/29/2019  Procedures: Visit Diagnoses: No diagnosis found.  Large Joint Inj: R knee on 10/29/2019 3:58 PM Indications: diagnostic evaluation and pain Details: 22 G 1.5 in needle, superolateral approach  Arthrogram: No  Medications: 3 mL lidocaine 1 %; 88 mg Hyaluronan 88 MG/4ML Outcome: tolerated well, no immediate complications Procedure, treatment alternatives, risks and benefits explained, specific risks discussed. Consent was given by the patient. Immediately prior to procedure a time out was called to verify the correct patient, procedure, equipment, support staff and site/side marked as required. Patient was prepped and draped in the usual sterile fashion.   Large Joint Inj: L knee on 10/29/2019 3:58 PM Indications: diagnostic evaluation and pain Details: 22 G 1.5 in needle, superolateral approach  Arthrogram: No  Medications: 3 mL lidocaine 1 %; 88 mg Hyaluronan 88 MG/4ML Outcome: tolerated well, no immediate complications Procedure, treatment alternatives, risks and benefits explained, specific risks discussed. Consent was given by the patient. Immediately prior to procedure a time out was called to verify the correct patient, procedure, equipment, support staff and site/side marked as required. Patient was prepped and draped in the usual sterile fashion.    The patient is here today for hyaluronic acid injections in both her knees to treat the pain from osteoarthritis.  She has had other forms of conservative treatment.  Unfortunately she is only 44 years old with moderate arthritis in her knees.  Examination of both knees show just a mild effusion with painful arc of motion.  I did place Monovisc in both knees today without difficulty.  She understands the rationale behind injection such as this as well as the risk and benefits involved.  All  questions and concerns were answered and addressed.  She is traveling to [place] this weekend.  I wish her the best of luck on her trip and enjoyment.  We will see her back as needed.

## 2019-11-11 ENCOUNTER — Ambulatory Visit: Payer: Managed Care, Other (non HMO) | Admitting: Orthopaedic Surgery

## 2020-01-01 ENCOUNTER — Other Ambulatory Visit: Payer: Self-pay | Admitting: Family Medicine

## 2020-01-20 ENCOUNTER — Ambulatory Visit: Payer: Managed Care, Other (non HMO) | Admitting: Family Medicine

## 2020-01-20 ENCOUNTER — Other Ambulatory Visit: Payer: Self-pay

## 2020-01-20 ENCOUNTER — Encounter: Payer: Self-pay | Admitting: Family Medicine

## 2020-01-20 VITALS — BP 120/70 | HR 70 | Wt 196.6 lb

## 2020-01-20 DIAGNOSIS — R6882 Decreased libido: Secondary | ICD-10-CM | POA: Diagnosis not present

## 2020-01-20 DIAGNOSIS — R5383 Other fatigue: Secondary | ICD-10-CM

## 2020-01-20 DIAGNOSIS — R450 Nervousness: Secondary | ICD-10-CM

## 2020-01-20 DIAGNOSIS — E559 Vitamin D deficiency, unspecified: Secondary | ICD-10-CM

## 2020-01-20 DIAGNOSIS — Z87898 Personal history of other specified conditions: Secondary | ICD-10-CM

## 2020-01-20 DIAGNOSIS — E890 Postprocedural hypothyroidism: Secondary | ICD-10-CM

## 2020-01-20 NOTE — Progress Notes (Signed)
   Subjective:    Patient ID: Joanne Hughes, female    DOB: [birth year], 44 y.o.   MRN: [id number]  HPI Chief Complaint  Patient presents with  . fatigue    extreme fatigue 6 months, wants to be checked for thyroid levels, pre menopausal and pre diabetes.    She is here to discuss severe fatigue for the past 6 months or so. States occasionally she feels jittery and "heavy" like she may have low blood sugar.   She would like to have her labs checked including thyroid function and hormone levels.   States her libido has been decreased for the past year. Denies dyspareunia. States she finds sex pleasurable when having it but the desire is not there and does not know why this is the case.   IUD in place  Periods are regular every month.   Denies fever, chills, dizziness, night sweats, hot flashes, chest pain, palpitations, shortness of breath, abdominal pain, N/V/D, urinary symptoms, LE edema.   Vitamin D def- taking 5,000 IUs   Hx of prediabetes  Hx of hypothyroidism   Reviewed allergies, medications, past medical, surgical, family, and social history.    Review of Systems Pertinent positives and negatives in the history of present illness.     Objective:   Physical Exam BP 120/70   Pulse 70   Wt 196 lb 9.6 oz (89.2 kg)   BMI 32.22 kg/m   Alert and oriented and in no acute distress.       Assessment & Plan:  Fatigue, unspecified type - Plan: CBC with Differential/Platelet, Comprehensive metabolic panel, Hemoglobin A1c, VITAMIN D 25 Hydroxy (Vit-D Deficiency, Fractures), Vitamin B12, Iron, TIBC and Ferritin Panel Discussed possible etiologies for fatigue. Will check labs and follow up  Vitamin D deficiency - Plan: VITAMIN D 25 Hydroxy (Vit-D Deficiency, Fractures) -she is taking 5,000 IUs daily. Check vitamin D level and follow up  Decreased libido - Plan: T4, free, TSH, T3, Estrogens, Total, FSH/LH -check labs. No dyspareunia or obvious explanation for this.    History of prediabetes - Plan: Comprehensive metabolic panel, Hemoglobin A1c -she may be experiencing periods of hypoglycemia and I recommend checking her BS when she has symptoms.   Postoperative hypothyroidism - Plan: T4, free, TSH, T3 -is not on medication. Check labs   Jittery feeling - Plan: Hemoglobin A1c, T4, free, TSH, T3, Vitamin B12, Iron, TIBC and Ferritin Panel -she may be experiencing periods of hypoglycemia and I recommend checking her BS when she has symptoms.

## 2020-01-22 LAB — COMPREHENSIVE METABOLIC PANEL
ALT: 11 IU/L (ref 0–32)
AST: 18 IU/L (ref 0–40)
Albumin/Globulin Ratio: 1.7 (ref 1.2–2.2)
Albumin: 4.5 g/dL (ref 3.8–4.8)
Alkaline Phosphatase: 70 IU/L (ref 48–121)
BUN/Creatinine Ratio: 14 (ref 9–23)
BUN: 10 mg/dL (ref 6–24)
Bilirubin Total: 0.7 mg/dL (ref 0.0–1.2)
CO2: 23 mmol/L (ref 20–29)
Calcium: 9.6 mg/dL (ref 8.7–10.2)
Chloride: 103 mmol/L (ref 96–106)
Creatinine, Ser: 0.73 mg/dL (ref 0.57–1.00)
GFR calc Af Amer: 115 mL/min/{1.73_m2} (ref >59)
GFR calc non Af Amer: 100 mL/min/{1.73_m2} (ref >59)
Globulin, Total: 2.6 g/dL (ref 1.5–4.5)
Glucose: 87 mg/dL (ref 65–99)
Potassium: 4.4 mmol/L (ref 3.5–5.2)
Sodium: 138 mmol/L (ref 134–144)
Total Protein: 7.1 g/dL (ref 6.0–8.5)

## 2020-01-22 LAB — CBC WITH DIFFERENTIAL/PLATELET
Basophils Absolute: 0 10*3/uL (ref 0.0–0.2)
Basos: 0 %
EOS (ABSOLUTE): 0.1 10*3/uL (ref 0.0–0.4)
Eos: 1 %
Hematocrit: 35.8 % (ref 34.0–46.6)
Hemoglobin: 11.1 g/dL (ref 11.1–15.9)
Immature Grans (Abs): 0 10*3/uL (ref 0.0–0.1)
Immature Granulocytes: 0 %
Lymphocytes Absolute: 1.9 10*3/uL (ref 0.7–3.1)
Lymphs: 37 %
MCH: 27.8 pg (ref 26.6–33.0)
MCHC: 31 g/dL — ABNORMAL LOW (ref 31.5–35.7)
MCV: 90 fL (ref 79–97)
Monocytes Absolute: 0.3 10*3/uL (ref 0.1–0.9)
Monocytes: 6 %
Neutrophils Absolute: 2.8 10*3/uL (ref 1.4–7.0)
Neutrophils: 56 %
Platelets: 302 10*3/uL (ref 150–450)
RBC: 4 x10E6/uL (ref 3.77–5.28)
RDW: 14.7 % (ref 11.7–15.4)
WBC: 5.1 10*3/uL (ref 3.4–10.8)

## 2020-01-22 LAB — IRON,TIBC AND FERRITIN PANEL
Ferritin: 24 ng/mL (ref 15–150)
Iron Saturation: 10 % — ABNORMAL LOW (ref 15–55)
Iron: 39 ug/dL (ref 27–159)
Total Iron Binding Capacity: 396 ug/dL (ref 250–450)
UIBC: 357 ug/dL (ref 131–425)

## 2020-01-22 LAB — FSH/LH
FSH: 6.8 m[IU]/mL
LH: 24.5 m[IU]/mL

## 2020-01-22 LAB — HEMOGLOBIN A1C
Est. average glucose Bld gHb Est-mCnc: 120 mg/dL
Hgb A1c MFr Bld: 5.8 % — ABNORMAL HIGH (ref 4.8–5.6)

## 2020-01-22 LAB — T3: T3, Total: 81 ng/dL (ref 71–180)

## 2020-01-22 LAB — TSH: TSH: 1.1 u[IU]/mL (ref 0.450–4.500)

## 2020-01-22 LAB — ESTROGENS, TOTAL: Estrogen: 430 pg/mL

## 2020-01-22 LAB — T4, FREE: Free T4: 1.19 ng/dL (ref 0.82–1.77)

## 2020-01-22 LAB — VITAMIN B12: Vitamin B-12: 753 pg/mL (ref 232–1245)

## 2020-01-22 LAB — VITAMIN D 25 HYDROXY (VIT D DEFICIENCY, FRACTURES): Vit D, 25-Hydroxy: 33 ng/mL (ref 30.0–100.0)

## 2020-05-28 ENCOUNTER — Other Ambulatory Visit: Payer: Self-pay | Admitting: Family Medicine

## 2020-06-16 ENCOUNTER — Other Ambulatory Visit: Payer: Self-pay

## 2020-06-16 ENCOUNTER — Encounter (HOSPITAL_COMMUNITY): Payer: Self-pay

## 2020-06-16 DIAGNOSIS — R1032 Left lower quadrant pain: Secondary | ICD-10-CM | POA: Insufficient documentation

## 2020-06-16 DIAGNOSIS — Z79899 Other long term (current) drug therapy: Secondary | ICD-10-CM | POA: Insufficient documentation

## 2020-06-16 DIAGNOSIS — E039 Hypothyroidism, unspecified: Secondary | ICD-10-CM | POA: Diagnosis not present

## 2020-06-16 LAB — CBC
HCT: 38.3 % (ref 36.0–46.0)
Hemoglobin: 12 g/dL (ref 12.0–15.0)
MCH: 28 pg (ref 26.0–34.0)
MCHC: 31.3 g/dL (ref 30.0–36.0)
MCV: 89.3 fL (ref 80.0–100.0)
Platelets: 293 10*3/uL (ref 150–400)
RBC: 4.29 MIL/uL (ref 3.87–5.11)
RDW: 13.7 % (ref 11.5–15.5)
WBC: 9.7 10*3/uL (ref 4.0–10.5)
nRBC: 0 % (ref 0.0–0.2)

## 2020-06-16 MED ORDER — OXYCODONE-ACETAMINOPHEN 5-325 MG PO TABS
1.0000 | ORAL_TABLET | ORAL | Status: AC | PRN
  Administered 2020-06-16 – 2020-06-17 (×2): 1 via ORAL
  Filled 2020-06-16 (×2): qty 1

## 2020-06-17 ENCOUNTER — Encounter (HOSPITAL_COMMUNITY): Payer: Self-pay | Admitting: Emergency Medicine

## 2020-06-17 ENCOUNTER — Emergency Department (HOSPITAL_COMMUNITY): Payer: Managed Care, Other (non HMO)

## 2020-06-17 ENCOUNTER — Emergency Department (HOSPITAL_COMMUNITY)
Admission: EM | Admit: 2020-06-17 | Discharge: 2020-06-17 | Disposition: A | Discharge status: Home / Self Care | Payer: Managed Care, Other (non HMO) | Attending: Emergency Medicine | Admitting: Emergency Medicine

## 2020-06-17 DIAGNOSIS — R109 Unspecified abdominal pain: Secondary | ICD-10-CM

## 2020-06-17 LAB — URINALYSIS, ROUTINE W REFLEX MICROSCOPIC
Bilirubin Urine: NEGATIVE
Glucose, UA: NEGATIVE mg/dL
Hgb urine dipstick: NEGATIVE
Ketones, ur: 20 mg/dL — AB
Leukocytes,Ua: NEGATIVE
Nitrite: NEGATIVE
Protein, ur: NEGATIVE mg/dL
Specific Gravity, Urine: 1.026 (ref 1.005–1.030)
pH: 7 (ref 5.0–8.0)

## 2020-06-17 LAB — COMPREHENSIVE METABOLIC PANEL
ALT: 13 U/L (ref 0–44)
AST: 19 U/L (ref 15–41)
Albumin: 4.5 g/dL (ref 3.5–5.0)
Alkaline Phosphatase: 57 U/L (ref 38–126)
Anion gap: 11 (ref 5–15)
BUN: 12 mg/dL (ref 6–20)
CO2: 23 mmol/L (ref 22–32)
Calcium: 9.4 mg/dL (ref 8.9–10.3)
Chloride: 105 mmol/L (ref 98–111)
Creatinine, Ser: 0.71 mg/dL (ref 0.44–1.00)
GFR, Estimated: >60 mL/min (ref >60)
Glucose, Bld: 123 mg/dL — ABNORMAL HIGH (ref 70–99)
Potassium: 3.9 mmol/L (ref 3.5–5.1)
Sodium: 139 mmol/L (ref 135–145)
Total Bilirubin: 0.7 mg/dL (ref 0.3–1.2)
Total Protein: 7.8 g/dL (ref 6.5–8.1)

## 2020-06-17 LAB — LIPASE, BLOOD: Lipase: 31 U/L (ref 11–51)

## 2020-06-17 LAB — I-STAT BETA HCG BLOOD, ED (MC, WL, AP ONLY): I-stat hCG, quantitative: <5 m[IU]/mL (ref <5)

## 2020-06-17 MED ORDER — SIMETHICONE 40 MG/0.6ML PO SUSP (UNIT DOSE)
40.0000 mg | Freq: Once | ORAL | Status: AC
  Administered 2020-06-17: 40 mg via ORAL
  Filled 2020-06-17: qty 0.6

## 2020-06-17 MED ORDER — ONDANSETRON HCL 4 MG/2ML IJ SOLN
4.0000 mg | Freq: Once | INTRAMUSCULAR | Status: AC
  Administered 2020-06-17: 4 mg via INTRAVENOUS

## 2020-06-17 MED ORDER — ONDANSETRON HCL 4 MG/2ML IJ SOLN
4.0000 mg | Freq: Once | INTRAMUSCULAR | Status: DC
  Filled 2020-06-17: qty 2

## 2020-06-17 MED ORDER — ALUM & MAG HYDROXIDE-SIMETH 200-200-20 MG/5ML PO SUSP
30.0000 mL | Freq: Once | ORAL | Status: AC
  Administered 2020-06-17: 30 mL via ORAL
  Filled 2020-06-17: qty 30

## 2020-06-17 MED ORDER — IOHEXOL 350 MG/ML SOLN
100.0000 mL | Freq: Once | INTRAVENOUS | Status: AC | PRN
  Administered 2020-06-17: 100 mL via INTRAVENOUS

## 2020-06-17 MED ORDER — DICYCLOMINE HCL 10 MG/ML IM SOLN
20.0000 mg | Freq: Once | INTRAMUSCULAR | Status: AC
  Administered 2020-06-17: 20 mg via INTRAMUSCULAR
  Filled 2020-06-17: qty 2

## 2020-06-17 MED ORDER — KETOROLAC TROMETHAMINE 30 MG/ML IJ SOLN
30.0000 mg | Freq: Once | INTRAMUSCULAR | Status: AC
  Administered 2020-06-17: 30 mg via INTRAVENOUS

## 2020-06-17 MED ORDER — KETOROLAC TROMETHAMINE 60 MG/2ML IM SOLN: 60.0000 mg | Freq: Once | INTRAMUSCULAR | Status: DC

## 2020-06-17 MED ORDER — ONDANSETRON 8 MG PO TBDP: 8.0000 mg | ORAL_TABLET | Freq: Once | ORAL | Status: DC

## 2020-06-17 MED ORDER — DICYCLOMINE HCL 10 MG/ML IM SOLN: 20.0000 mg | Freq: Once | INTRAMUSCULAR | Status: DC

## 2020-06-17 MED ORDER — KETOROLAC TROMETHAMINE 30 MG/ML IJ SOLN
30.0000 mg | Freq: Once | INTRAMUSCULAR | Status: DC
  Filled 2020-06-17: qty 1

## 2020-06-17 MED ORDER — DICYCLOMINE HCL 20 MG PO TABS: 20.0000 mg | ORAL_TABLET | Freq: Two times a day (BID) | ORAL | 0 refills | Status: DC

## 2020-06-17 NOTE — ED Provider Notes (Addendum)
[facility] DEPT Provider Note   CSN: [id number] Arrival date & time: 06/16/20  2315     History Chief Complaint  Patient presents with  . Flank Pain    Joanne Hughes is a 45 y.o. female.  The history is provided by the patient.  Flank Pain This is a new problem. The current episode started 3 to 5 hours ago. The problem occurs constantly. The problem has not changed since onset.Associated symptoms include abdominal pain. Pertinent negatives include no chest pain, no headaches and no shortness of breath. Nothing aggravates the symptoms. Nothing relieves the symptoms. Treatments tried: gas X. The treatment provided no relief.  Patient presents with L flank pain and abdominal pain since 6 pm.  Patient ate a hamburger prior to the onset of the pain.  No urinary symptoms.  No n/v/d.  No f/c/r.  No trauma.       Past Medical History:  Diagnosis Date  . Elevated LDL cholesterol level 10/30/2018  . Hidradenitis   . Hirsutism    tried spironolactone and vaniqa in past without success  . History of elevated lipids   . History of prediabetes    A1c 5.9% January 2017  . Hypothyroid   . IUD contraception   . MRSA (methicillin resistant Staphylococcus aureus)    per medical record from [place]  . PCOS (polycystic ovarian syndrome)   . Vitamin D deficiency 11/03/2018    Patient Active Problem List   Diagnosis Date Noted  . Decreased libido 01/20/2020  . Situational stress 04/02/2019  . Anxiety and depression 04/02/2019  . Vitamin D deficiency 11/03/2018  . Elevated LDL cholesterol level 10/30/2018  . Acute pain of left knee 09/12/2016  . History of prediabetes 07/12/2016  . Postoperative hypothyroidism 07/12/2016  . History of PCOS 07/12/2016  . IUD contraception 07/12/2016  . Hirsutism   . Hidradenitis   . MRSA (methicillin resistant Staphylococcus aureus)     Past Surgical History:  Procedure Laterality Date  . BREAST REDUCTION SURGERY   05/1997  . CESAREAN SECTION    . CHOLECYSTECTOMY    . gastric sleeve surgery  08/2015   surgeon Dr. Roanna Raider of [place]  . REDUCTION MAMMAPLASTY Bilateral   . THYROIDECTOMY  06/04/2013   surgeon Dr. Haywood Pao of [place]     OB History    Gravida  2   Para      Term      Preterm      AB  1   Living  1     SAB      IAB  1   Ectopic      Multiple      Live Births  1           Family History  Problem Relation Age of Onset  . Hypertension Mother   . Hypothyroidism Mother   . Hypertension Father   . Stroke Father   . Diabetes Sister   . Leukemia Sister   . Breast cancer Cousin     Social History   Tobacco Use  . Smoking status: Never Smoker  . Smokeless tobacco: Never Used  Vaping Use  . Vaping Use: Never used  Substance Use Topics  . Alcohol use: Yes    Alcohol/week: 2.0 standard drinks    Types: 2 Glasses of wine per week  . Drug use: No    Home Medications Prior to Admission medications   Medication Sig Start Date End Date Taking? Authorizing Provider  CALCIUM CITRATE PO Take 3 tablets by mouth daily.    [provider]  Cholecalciferol (VITAMIN D PO) Take by mouth.    [provider]  Cyanocobalamin (VITAMIN B-12 PO) Take by mouth.    [provider]  EUTHYROX 137 MCG tablet TAKE 1 TABLET BY MOUTH ONCE DAILY BEFORE BREAKFAST 05/28/20   Henson, Vickie L, NP-C  ketoconazole (NIZORAL) 2 % shampoo Apply 1 application topically 2 (two) times a week. 08/01/18   Henson, Vickie L, NP-C  levonorgestrel (MIRENA) 20 MCG/24HR IUD 1 each by Intrauterine route once.    [provider]  Multiple Vitamins-Minerals (CENTRUM SILVER 50+WOMEN PO) Take by mouth.    [provider]    Allergies    Patient has no known allergies.  Review of Systems   Review of Systems  Constitutional: Negative for fever.  HENT: Negative for congestion.   Eyes: Negative for visual disturbance.  Respiratory: Negative for shortness  of breath.   Cardiovascular: Negative for chest pain.  Gastrointestinal: Positive for abdominal pain.  Genitourinary: Positive for flank pain.  Musculoskeletal: Negative for arthralgias.  Skin: Negative for rash.  Neurological: Negative for headaches.  Psychiatric/Behavioral: Negative for agitation.  All other systems reviewed and are negative.   Physical Exam Updated Vital Signs BP (!) 157/66 (BP Location: Left Arm)   Pulse 79   Temp 97.6 F (36.4 C) (Oral)   Resp 19   Ht 5\' 5"  (1.651 m)   Wt 89.4 kg   SpO2 100%   BMI 32.78 kg/m   Physical Exam Vitals and nursing note reviewed.  Constitutional:      General: She is not in acute distress.    Appearance: Normal appearance.  HENT:     Head: Normocephalic and atraumatic.     Nose: Nose normal.  Eyes:     Conjunctiva/sclera: Conjunctivae normal.     Pupils: Pupils are equal, round, and reactive to light.  Cardiovascular:     Rate and Rhythm: Normal rate and regular rhythm.     Pulses: Normal pulses.     Heart sounds: Normal heart sounds.  Pulmonary:     Effort: Pulmonary effort is normal.     Breath sounds: Normal breath sounds.  Abdominal:     General: Abdomen is flat. Bowel sounds are normal.     Palpations: Abdomen is soft.     Tenderness: There is no abdominal tenderness. There is no guarding or rebound.  Musculoskeletal:        General: Normal range of motion.     Cervical back: Normal range of motion and neck supple.  Skin:    General: Skin is warm and dry.     Capillary Refill: Capillary refill takes less than 2 seconds.  Neurological:     General: No focal deficit present.     Mental Status: She is alert and oriented to person, place, and time.     Deep Tendon Reflexes: Reflexes normal.  Psychiatric:        Mood and Affect: Mood normal.        Behavior: Behavior normal.     ED Results / Procedures / Treatments   Labs (all labs ordered are listed, but only abnormal results are displayed) Results for  orders placed or performed during the hospital encounter of 06/17/20  Lipase, blood  Result Value Ref Range   Lipase 31 11 - 51 U/L  Comprehensive metabolic panel  Result Value Ref Range   Sodium 139 135 - 145  mmol/L   Potassium 3.9 3.5 - 5.1 mmol/L   Chloride 105 98 - 111 mmol/L   CO2 23 22 - 32 mmol/L   Glucose, Bld 123 (H) 70 - 99 mg/dL   BUN 12 6 - 20 mg/dL   Creatinine, Ser 0.71 0.44 - 1.00 mg/dL   Calcium 9.4 8.9 - 10.3 mg/dL   Total Protein 7.8 6.5 - 8.1 g/dL   Albumin 4.5 3.5 - 5.0 g/dL   AST 19 15 - 41 U/L   ALT 13 0 - 44 U/L   Alkaline Phosphatase 57 38 - 126 U/L   Total Bilirubin 0.7 0.3 - 1.2 mg/dL   GFR, Estimated >60 >60 mL/min   Anion gap 11 5 - 15  CBC  Result Value Ref Range   WBC 9.7 4.0 - 10.5 K/uL   RBC 4.29 3.87 - 5.11 MIL/uL   Hemoglobin 12.0 12.0 - 15.0 g/dL   HCT 38.3 36.0 - 46.0 %   MCV 89.3 80.0 - 100.0 fL   MCH 28.0 26.0 - 34.0 pg   MCHC 31.3 30.0 - 36.0 g/dL   RDW 13.7 11.5 - 15.5 %   Platelets 293 150 - 400 K/uL   nRBC 0.0 0.0 - 0.2 %  Urinalysis, Routine w reflex microscopic  Result Value Ref Range   Color, Urine YELLOW YELLOW   APPearance HAZY (A) CLEAR   Specific Gravity, Urine 1.026 1.005 - 1.030   pH 7.0 5.0 - 8.0   Glucose, UA NEGATIVE NEGATIVE mg/dL   Hgb urine dipstick NEGATIVE NEGATIVE   Bilirubin Urine NEGATIVE NEGATIVE   Ketones, ur 20 (A) NEGATIVE mg/dL   Protein, ur NEGATIVE NEGATIVE mg/dL   Nitrite NEGATIVE NEGATIVE   Leukocytes,Ua NEGATIVE NEGATIVE  I-Stat beta hCG blood, ED  Result Value Ref Range   I-stat hCG, quantitative <5.0 <5 mIU/mL   Comment 3           CT Renal Stone Study  Result Date: 06/17/2020 CLINICAL DATA:  Left flank pain EXAM: CT ABDOMEN AND PELVIS WITHOUT CONTRAST TECHNIQUE: Multidetector CT imaging of the abdomen and pelvis was performed following the standard protocol without IV contrast. COMPARISON:  None. FINDINGS: Lower chest: The visualized lung bases are clear. The visualized heart and  pericardium are unremarkable. Hepatobiliary: No focal liver abnormality is seen. Status post cholecystectomy. No biliary dilatation. Pancreas: Unremarkable Spleen: Unremarkable Adrenals/Urinary Tract: Adrenal glands are unremarkable. Kidneys are normal, without renal calculi, focal lesion, or hydronephrosis. Bladder is unremarkable. Stomach/Bowel: Surgical changes of Roux-en-Y gastric bypass are identified. The stomach, small bowel, and large bowel are otherwise unremarkable. Appendix normal. No evidence of obstruction or focal inflammation. No free intraperitoneal gas. Trace free fluid within the pelvis may be physiologic in a female patient of this age. Vascular/Lymphatic: No significant vascular findings are present. No enlarged abdominal or pelvic lymph nodes. Reproductive: Intrauterine device in expected position within the uterus. The pelvic organs are otherwise unremarkable. Other: No abdominal wall hernia identified.  Rectum unremarkable. Musculoskeletal: No acute bone abnormality. IMPRESSION: No acute intra-abdominal pathology identified. No definite radiographic explanation for the patient's reported symptoms. Electronically Signed   By: Fidela Salisbury MD   On: 06/17/2020 02:27    Radiology No results found.  Procedures Procedures   Medications Ordered in ED Medications  oxyCODONE-acetaminophen (PERCOCET/ROXICET) 5-325 MG per tablet 1 tablet (1 tablet Oral Given 06/16/20 2339)  alum & mag hydroxide-simeth (MAALOX/MYLANTA) 200-200-20 MG/5ML suspension 30 mL (30 mLs Oral Given 06/17/20 0250)  ketorolac (TORADOL) 30 MG/ML injection  30 mg (30 mg Intravenous Given 06/17/20 0250)  ondansetron (ZOFRAN) injection 4 mg (4 mg Intravenous Given 06/17/20 0251)    ED Course  I have reviewed the triage vital signs and the nursing notes.  Pertinent labs & imaging results that were available during my care of the patient were reviewed by me and considered in my medical decision making (see chart for  details).   Well appearing, exam and vitals are benign and reassuring.  CT is negative for acute pathology.  Pain has been treated in the ED.  Patient's pain did not improve with pain medication and EDP discussed CTA of the abdomen with the patient to exclude mesenteric ischemia.  Patient agreed to this test.   I suspect this is gas and spasm.  Patient is stable for discharge with close follow up. Strict return precautions given.    Ralph Benavidez was evaluated in Emergency Department on 06/17/2020 for the symptoms described in the history of present illness. She was evaluated in the context of the global COVID-19 pandemic, which necessitated consideration that the patient might be at risk for infection with the SARS-CoV-2 virus that causes COVID-19. Institutional protocols and algorithms that pertain to the evaluation of patients at risk for COVID-19 are in a state of rapid change based on information released by regulatory bodies including the CDC and federal and state organizations. These policies and algorithms were followed during the patient's care in the ED.  Final Clinical Impression(s) / ED Diagnoses Return for intractable cough, coughing up blood, fevers >100.4 unrelieved by medication, shortness of breath, intractable vomiting, chest pain, shortness of breath, weakness, numbness, changes in speech, facial asymmetry, abdominal pain, passing out, Inability to tolerate liquids or food, cough, altered mental status or any concerns. No signs of systemic illness or infection. The patient is nontoxic-appearing on exam and vital signs are within normal limits.  I have reviewed the triage vital signs and the nursing notes. Pertinent labs & imaging results that were available during my care of the patient were reviewed by me and considered in my medical decision making (see chart for details). After history, exam, and medical workup I feel the patient has been appropriately medically screened and is safe for  discharge home. Pertinent diagnoses were discussed with the patient. Patient was given return precautions.    Armstead Heiland, MD 06/17/20 [place], Therasa Lorenzi, MD 06/17/20 [id number]

## 2020-09-23 ENCOUNTER — Other Ambulatory Visit: Payer: Self-pay | Admitting: Family Medicine

## 2020-09-23 NOTE — Telephone Encounter (Signed)
Received fax from [facility] for a refill on the pts. euthyrox pt. Last apt was 01/20/20.

## 2020-09-23 NOTE — Telephone Encounter (Signed)
I do not see that she was on this medication at our last visit. Please find out who prescribed this for her and how long she has been on it.

## 2020-09-24 NOTE — Telephone Encounter (Signed)
Refilled until august

## 2020-09-24 NOTE — Telephone Encounter (Signed)
Ok to refill 

## 2020-09-24 NOTE — Telephone Encounter (Signed)
Pt has been on this medication the whole time it was filled back in January and pt has appt scheduled in august

## 2020-10-12 ENCOUNTER — Ambulatory Visit: Payer: Managed Care, Other (non HMO) | Admitting: Obstetrics and Gynecology

## 2020-11-17 ENCOUNTER — Encounter: Payer: Self-pay | Admitting: Internal Medicine

## 2020-12-29 ENCOUNTER — Other Ambulatory Visit: Payer: Self-pay

## 2020-12-29 ENCOUNTER — Ambulatory Visit: Payer: 59 | Admitting: Family Medicine

## 2020-12-29 ENCOUNTER — Encounter: Payer: Self-pay | Admitting: Family Medicine

## 2020-12-29 VITALS — BP 118/68 | HR 84 | Temp 98.1°F | Ht 65.0 in | Wt 197.2 lb

## 2020-12-29 DIAGNOSIS — L02213 Cutaneous abscess of chest wall: Secondary | ICD-10-CM | POA: Diagnosis not present

## 2020-12-29 MED ORDER — DOXYCYCLINE HYCLATE 100 MG PO TABS: 100.0000 mg | ORAL_TABLET | Freq: Two times a day (BID) | ORAL | 0 refills | Status: DC

## 2020-12-29 NOTE — Patient Instructions (Signed)
Continue to apply warm compresses to the skin lesion at the the left inner breast. Do this 3-4 times/day for the next few days, in case there is further pus that needs to be drained. Take the antibiotics twice daily.  If everything clears up 100% after a week, you can stop then, otherwise complete the full 10 day course.

## 2020-12-29 NOTE — Progress Notes (Signed)
Chief Complaint  Patient presents with   Recurrent Skin Infections    Had same issues about 30 years ago-boil came up about three days ago and burst. Is healing but feels like she may need abx to finish the healing process.    4-5 days ago she started with red, tender lump on her chest, on the inner portion of the left breast She applied warm compresses, and it started draining yellow pus 2 days ago.  L breast was very red, achey.  Redness improved yesterday. No current draining. No fever  Similar issue at age 14 in the same area, wasn't treated for a while, wouldn't resolve and ultimately needed surgery at 22.  She has had similar problems in her axilla from hydradenitis suppurativa (also had surgery, 15 years ago). Hasn't had any recent problems until now.  PMH, PSH, SH reviewed  Outpatient Encounter Medications as of 12/29/2020  Medication Sig   CALCIUM CITRATE PO Take 3 tablets by mouth daily.   Cholecalciferol (VITAMIN D3) 125 MCG (5000 UT) CAPS Take 5,000 each by mouth daily.   Cyanocobalamin (VITAMIN B-12 PO) Take by mouth.   EUTHYROX 137 MCG tablet TAKE 1 TABLET BY MOUTH ONCE DAILY BEFORE BREAKFAST   ketoconazole (NIZORAL) 2 % shampoo Apply 1 application topically 2 (two) times a week.   levonorgestrel (MIRENA) 20 MCG/24HR IUD 1 each by Intrauterine route once.   Multiple Vitamins-Minerals (CENTRUM SILVER 50+WOMEN PO) Take by mouth.   [DISCONTINUED] Cholecalciferol (VITAMIN D PO) Take by mouth.   [DISCONTINUED] dicyclomine (BENTYL) 20 MG tablet Take 1 tablet (20 mg total) by mouth 2 (two) times daily. (Patient not taking: Reported on 12/29/2020)   No facility-administered encounter medications on file as of 12/29/2020.   No Known Allergies   ROS: Denies fever, chills, URI symptoms, headaches, dizziness, shortness of breath, chest pain.  Denies nausea, vomiting, bowel changes, urinary complaints, bleeding, bruising, rash. See HPI  PHYSICAL EXAM:  BP 118/68   Pulse 84   Temp  98.1 F (36.7 C) (Tympanic)   Ht '5\' 5"'$  (1.651 m)   Wt 197 lb 3.2 oz (89.4 kg)   LMP 12/20/2020   BMI 32.82 kg/m   Well-appearing, pleasant female in no distress HEENT: conjunctiva and sclera are clear, EOMI, wearing mask Skin: 7 x 4 mm area of erythema at the inner portion of the left breast. This is raised, slightly [place] at center, but no active drainage, no fluctuance. There is a WHSS in an upside-down Y shape, and this recurrent abscess is on the left side of the "Y". No surrounding erythema No axillary lymphadenopaty (just WHSS L axilla)   ASSESSMENT/PLAN:  Cutaneous abscess of chest wall - Plan: doxycycline (VIBRA-TABS) 100 MG tablet  Abscess drained on its own, and surrounding redness and pain improved after draining. Continue to use warm compresses (may potentially have a small amount left to drain, declined any I&D today). Treat with ABX for 7-10d. F/u if recurrent issues.

## 2020-12-30 ENCOUNTER — Encounter: Payer: Managed Care, Other (non HMO) | Admitting: Family Medicine

## 2021-01-19 ENCOUNTER — Encounter: Payer: Self-pay | Admitting: Family Medicine

## 2021-01-20 ENCOUNTER — Encounter: Payer: Self-pay | Admitting: Family Medicine

## 2021-01-20 ENCOUNTER — Other Ambulatory Visit: Payer: Self-pay

## 2021-01-20 ENCOUNTER — Ambulatory Visit: Payer: 59 | Admitting: Family Medicine

## 2021-01-20 VITALS — BP 124/80 | HR 70 | Temp 98.0°F | Wt 204.6 lb

## 2021-01-20 DIAGNOSIS — M25562 Pain in left knee: Secondary | ICD-10-CM | POA: Diagnosis not present

## 2021-01-20 DIAGNOSIS — G8929 Other chronic pain: Secondary | ICD-10-CM

## 2021-01-20 DIAGNOSIS — R202 Paresthesia of skin: Secondary | ICD-10-CM | POA: Diagnosis not present

## 2021-01-20 DIAGNOSIS — Z23 Encounter for immunization: Secondary | ICD-10-CM

## 2021-01-20 DIAGNOSIS — E890 Postprocedural hypothyroidism: Secondary | ICD-10-CM | POA: Diagnosis not present

## 2021-01-20 MED ORDER — MELOXICAM 15 MG PO TABS: 15.0000 mg | ORAL_TABLET | Freq: Every day | ORAL | 0 refills | Status: DC

## 2021-01-20 NOTE — Progress Notes (Signed)
   Subjective:    Patient ID: Joanne Hughes, female    DOB: 1975/12/28, 45 y.o.   MRN: [id number]  HPI Chief Complaint  Patient presents with   other    Tingling in legs feels like something crawling on her legs, lt. Knee is bothering her.    She is here with complaints of fleeting abnormal sensations bilateral lower extremities over the past week.  States occasionally it feels like a tingle or a tap and quickly resolves.  Denies lower extremity edema, pain.  No claudication symptoms. Left knee with intermittent chronic pain due to arthritis.  States she has been sitting in a desk in her home for hours in does not get up often.  States her husband had meloxicam so she took 1 and it helped her knee pain.  Request a prescription for meloxicam.  No other concerns or complaints today.  Denies fever, chills, dizziness, chest pain, palpitations, shortness of breath, abdominal pain, N/V/D, urinary symptoms, LE edema.   Reviewed allergies, medications, past medical, surgical, family, and social history.     Review of Systems Pertinent positives and negatives in the history of present illness.     Objective:   Physical Exam BP 124/80   Pulse 70   Temp 98 F (36.7 C)   Wt 204 lb 9.6 oz (92.8 kg)   BMI 34.05 kg/m   Alert and oriented in no acute distress.  Unremarkable left knee exam.  Bilateral lower extremities are neurovascularly intact.  No edema or skin changes.      Assessment & Plan:  Paresthesia of bilateral legs - Plan: CBC with Differential/Platelet, Comprehensive metabolic panel, TSH, Vitamin B12  Chronic pain of left knee - Plan: meloxicam (MOBIC) 15 MG tablet  Postoperative hypothyroidism - Plan: TSH  Exam is benign. No obvious explanation for her symptoms. Discussed leg exercises to help with knee pain. Also recommend she get a standing desk or get up every hour. Check labs and follow up

## 2021-01-21 ENCOUNTER — Other Ambulatory Visit: Payer: Self-pay | Admitting: Family Medicine

## 2021-01-21 DIAGNOSIS — Z1231 Encounter for screening mammogram for malignant neoplasm of breast: Secondary | ICD-10-CM

## 2021-01-21 LAB — CBC WITH DIFFERENTIAL/PLATELET
Basophils Absolute: 0 10*3/uL (ref 0.0–0.2)
Basos: 1 %
EOS (ABSOLUTE): 0.1 10*3/uL (ref 0.0–0.4)
Eos: 2 %
Hematocrit: 37.5 % (ref 34.0–46.6)
Hemoglobin: 11.8 g/dL (ref 11.1–15.9)
Immature Grans (Abs): 0 10*3/uL (ref 0.0–0.1)
Immature Granulocytes: 0 %
Lymphocytes Absolute: 1.6 10*3/uL (ref 0.7–3.1)
Lymphs: 38 %
MCH: 27.9 pg (ref 26.6–33.0)
MCHC: 31.5 g/dL (ref 31.5–35.7)
MCV: 89 fL (ref 79–97)
Monocytes Absolute: 0.3 10*3/uL (ref 0.1–0.9)
Monocytes: 8 %
Neutrophils Absolute: 2.2 10*3/uL (ref 1.4–7.0)
Neutrophils: 51 %
Platelets: 280 10*3/uL (ref 150–450)
RBC: 4.23 x10E6/uL (ref 3.77–5.28)
RDW: 13.3 % (ref 11.7–15.4)
WBC: 4.2 10*3/uL (ref 3.4–10.8)

## 2021-01-21 LAB — COMPREHENSIVE METABOLIC PANEL
ALT: 9 IU/L (ref 0–32)
AST: 15 IU/L (ref 0–40)
Albumin/Globulin Ratio: 1.6 (ref 1.2–2.2)
Albumin: 4.4 g/dL (ref 3.8–4.8)
Alkaline Phosphatase: 70 IU/L (ref 44–121)
BUN/Creatinine Ratio: 15 (ref 9–23)
BUN: 11 mg/dL (ref 6–24)
Bilirubin Total: 0.8 mg/dL (ref 0.0–1.2)
CO2: 22 mmol/L (ref 20–29)
Calcium: 9.1 mg/dL (ref 8.7–10.2)
Chloride: 102 mmol/L (ref 96–106)
Creatinine, Ser: 0.71 mg/dL (ref 0.57–1.00)
Globulin, Total: 2.7 g/dL (ref 1.5–4.5)
Glucose: 84 mg/dL (ref 65–99)
Potassium: 4.4 mmol/L (ref 3.5–5.2)
Sodium: 138 mmol/L (ref 134–144)
Total Protein: 7.1 g/dL (ref 6.0–8.5)
eGFR: 106 mL/min/{1.73_m2} (ref >59)

## 2021-01-21 LAB — TSH: TSH: 0.994 u[IU]/mL (ref 0.450–4.500)

## 2021-01-21 LAB — VITAMIN B12: Vitamin B-12: 795 pg/mL (ref 232–1245)

## 2021-03-01 DIAGNOSIS — Z1231 Encounter for screening mammogram for malignant neoplasm of breast: Secondary | ICD-10-CM

## 2021-03-06 ENCOUNTER — Other Ambulatory Visit: Payer: Self-pay | Admitting: Family Medicine

## 2021-04-13 ENCOUNTER — Other Ambulatory Visit (HOSPITAL_COMMUNITY): Payer: Self-pay

## 2021-04-13 MED ORDER — LEVOTHYROXINE SODIUM 137 MCG PO TABS
137.0000 ug | ORAL_TABLET | Freq: Every day | ORAL | 0 refills | Status: DC
  Filled 2021-04-13: qty 30, 30d supply, fill #0

## 2021-04-13 MED FILL — Levothyroxine Sodium Tab 137 MCG: ORAL | 30 days supply | Qty: 30 | Fill #0 | Status: CN

## 2021-04-19 DIAGNOSIS — H40013 Open angle with borderline findings, low risk, bilateral: Secondary | ICD-10-CM | POA: Diagnosis not present

## 2021-04-19 DIAGNOSIS — H5213 Myopia, bilateral: Secondary | ICD-10-CM | POA: Diagnosis not present

## 2021-05-09 ENCOUNTER — Encounter: Payer: Self-pay | Admitting: *Deleted

## 2021-05-26 ENCOUNTER — Other Ambulatory Visit: Payer: Self-pay | Admitting: Family Medicine

## 2021-05-26 ENCOUNTER — Other Ambulatory Visit (HOSPITAL_COMMUNITY): Payer: Self-pay

## 2021-05-26 MED ORDER — LEVOTHYROXINE SODIUM 137 MCG PO TABS
137.0000 ug | ORAL_TABLET | Freq: Every day | ORAL | 0 refills | Status: DC
Start: 2021-05-26 — End: 2021-07-18
  Filled 2021-05-26: qty 30, 30d supply, fill #0

## 2021-05-27 ENCOUNTER — Other Ambulatory Visit (HOSPITAL_COMMUNITY): Payer: Self-pay

## 2021-06-16 ENCOUNTER — Other Ambulatory Visit: Payer: Self-pay

## 2021-06-16 ENCOUNTER — Encounter: Payer: Self-pay | Admitting: Medical

## 2021-06-16 ENCOUNTER — Other Ambulatory Visit: Payer: Self-pay | Admitting: Medical

## 2021-06-16 ENCOUNTER — Other Ambulatory Visit (HOSPITAL_COMMUNITY)
Admission: RE | Admit: 2021-06-16 | Discharge: 2021-06-16 | Disposition: A | Discharge status: Home / Self Care | Payer: 59 | Source: Ambulatory Visit | Attending: Medical | Admitting: Medical

## 2021-06-16 ENCOUNTER — Ambulatory Visit (INDEPENDENT_AMBULATORY_CARE_PROVIDER_SITE_OTHER): Payer: 59 | Admitting: Medical

## 2021-06-16 VITALS — BP 128/81 | HR 74 | Ht 65.0 in | Wt 204.0 lb

## 2021-06-16 DIAGNOSIS — Z113 Encounter for screening for infections with a predominantly sexual mode of transmission: Secondary | ICD-10-CM | POA: Diagnosis not present

## 2021-06-16 DIAGNOSIS — Z1231 Encounter for screening mammogram for malignant neoplasm of breast: Secondary | ICD-10-CM

## 2021-06-16 DIAGNOSIS — Z01419 Encounter for gynecological examination (general) (routine) without abnormal findings: Secondary | ICD-10-CM

## 2021-06-16 DIAGNOSIS — N898 Other specified noninflammatory disorders of vagina: Secondary | ICD-10-CM | POA: Insufficient documentation

## 2021-06-16 NOTE — Progress Notes (Signed)
History:  Ms. Joanne Hughes is a 46 y.o. G2P0011 who presents to clinic today for annual exam with pap smear. Patient has concern of discharge with a sweet odor noted recently. She also notes a pelvic heaviness intermittently with cramping prior to her cycles. She has irregular periods lasting about 5 days/month. She has Mirena IUD in place since 2018. Last pap smear was normal with negative HPV in 2019. Last mammogram was normal in 2019 as well. Patient has mammogram order from PCP and will schedule. She desires STD testing today but declines labs for HIV, RPR, Hep B and Hep C.   The following portions of the patient's history were reviewed and updated as appropriate: allergies, current medications, family history, past medical history, social history, past surgical history and problem list.  Review of Systems:  Review of Systems  Constitutional:  Negative for fever and malaise/fatigue.  Gastrointestinal:  Positive for abdominal pain. Negative for constipation, diarrhea, nausea and vomiting.  Genitourinary:  Negative for dysuria, frequency and urgency.       Neg - vaginal bleeding + discharge + odor     Objective:  Physical Exam BP 128/81    Pulse 74    Ht 5\' 5"  (1.651 m)    Wt 204 lb (92.5 kg)    BMI 33.95 kg/m  Physical Exam Vitals and nursing note reviewed. Exam conducted with a chaperone present.  Constitutional:      General: She is not in acute distress.    Appearance: She is well-developed and normal weight.  HENT:     Head: Normocephalic and atraumatic.  Cardiovascular:     Rate and Rhythm: Normal rate and regular rhythm.     Heart sounds: No murmur heard. Pulmonary:     Effort: Pulmonary effort is normal. No respiratory distress.     Breath sounds: Normal breath sounds. No wheezing.  Chest:  Breasts:    Right: No swelling, bleeding, mass, nipple discharge or skin change (scarring from prior surgery noted under the breast tissue).     Left: No swelling, bleeding, mass,  nipple discharge or skin change.  Abdominal:     General: Abdomen is flat. Bowel sounds are normal. There is no distension.     Palpations: Abdomen is soft. There is no mass.     Tenderness: There is no abdominal tenderness. There is no guarding or rebound.  Genitourinary:    General: Normal vulva.     Vagina: Vaginal discharge (small, Griffo, mucous discharge noted) present. No bleeding.     Cervix: No cervical motion tenderness, discharge or friability.     Uterus: Not enlarged and not tender.      Adnexa:        Right: No mass or tenderness.         Left: No mass or tenderness.    Skin:    General: Skin is warm and dry.     Findings: No erythema.  Neurological:     Mental Status: She is alert and oriented to person, place, and time.  Psychiatric:        Mood and Affect: Mood normal.    Health Maintenance Due  Topic Date Due   COVID-19 Vaccine (1) Never done   HIV Screening  Never done   Hepatitis C Screening  Never done   COLONOSCOPY (Pts 45-75yrs Insurance coverage will need to be confirmed)  Never done   PAP SMEAR-Modifier  11/29/2020     Assessment & Plan:  1. Well woman  exam with routine gynecological exam - Cytology - PAP( [place]) - Cervicovaginal ancillary only( [place])  2. Screening for STD (sexually transmitted disease) - Cervicovaginal ancillary only( [place])  3. Vaginal discharge - Cervicovaginal ancillary only( [place])  Patient advised results will come through [facility] Patient to return in 1 year for annual exam or sooner PRN Encouraged to schedule mammogram as soon as possible   Approximately 15 minutes of total time was spent with this patient on chart review, history taking, physical exam, patient education, coordination of care and documentation.   Luvenia Redden, PA-C 06/16/2021 9:27 AM

## 2021-06-16 NOTE — Progress Notes (Signed)
Pt is in the office for annual exam Last pap 11-29-2017 Last mammogram 10-01-2017 Currently has IUD, placed in 2018 Pt reports some pelvic pain, discharge, and "sweet" odor that is not her normal scent. GAD-7= 3

## 2021-06-17 LAB — CERVICOVAGINAL ANCILLARY ONLY
Bacterial Vaginitis (gardnerella): NEGATIVE
Candida Glabrata: NEGATIVE
Candida Vaginitis: NEGATIVE
Chlamydia: NEGATIVE
Comment: NEGATIVE
Comment: NEGATIVE
Comment: NEGATIVE
Comment: NEGATIVE
Comment: NEGATIVE
Comment: NORMAL
Neisseria Gonorrhea: NEGATIVE
Trichomonas: NEGATIVE

## 2021-06-21 LAB — CYTOLOGY - PAP
Comment: NEGATIVE
Diagnosis: NEGATIVE
High risk HPV: NEGATIVE

## 2021-06-27 ENCOUNTER — Ambulatory Visit
Admission: RE | Admit: 2021-06-27 | Discharge: 2021-06-27 | Disposition: A | Discharge status: Home / Self Care | Payer: 59 | Source: Ambulatory Visit | Attending: Medical | Admitting: Medical

## 2021-06-27 DIAGNOSIS — Z1231 Encounter for screening mammogram for malignant neoplasm of breast: Secondary | ICD-10-CM

## 2021-06-29 ENCOUNTER — Ambulatory Visit
Admission: RE | Admit: 2021-06-29 | Discharge: 2021-06-29 | Disposition: A | Discharge status: Home / Self Care | Payer: 59 | Source: Ambulatory Visit | Attending: Medical | Admitting: Medical

## 2021-06-29 ENCOUNTER — Other Ambulatory Visit: Payer: Self-pay | Admitting: Medical

## 2021-06-29 ENCOUNTER — Ambulatory Visit: Payer: 59

## 2021-06-29 ENCOUNTER — Other Ambulatory Visit: Payer: Self-pay

## 2021-06-29 DIAGNOSIS — R928 Other abnormal and inconclusive findings on diagnostic imaging of breast: Secondary | ICD-10-CM

## 2021-06-29 DIAGNOSIS — R922 Inconclusive mammogram: Secondary | ICD-10-CM | POA: Diagnosis not present

## 2021-07-18 ENCOUNTER — Other Ambulatory Visit (HOSPITAL_COMMUNITY): Payer: Self-pay

## 2021-07-18 ENCOUNTER — Other Ambulatory Visit: Payer: Self-pay | Admitting: Family Medicine

## 2021-07-18 MED ORDER — LEVOTHYROXINE SODIUM 137 MCG PO TABS
137.0000 ug | ORAL_TABLET | Freq: Every day | ORAL | 0 refills | Status: DC
  Filled 2021-07-18: qty 30, 30d supply, fill #0

## 2021-07-28 ENCOUNTER — Ambulatory Visit: Payer: 59 | Admitting: Physician Assistant

## 2021-07-28 ENCOUNTER — Other Ambulatory Visit (HOSPITAL_COMMUNITY): Payer: Self-pay

## 2021-07-28 ENCOUNTER — Encounter: Payer: Self-pay | Admitting: Physician Assistant

## 2021-07-28 ENCOUNTER — Other Ambulatory Visit: Payer: Self-pay

## 2021-07-28 VITALS — BP 132/80 | HR 76 | Ht 65.0 in | Wt 206.8 lb

## 2021-07-28 DIAGNOSIS — E890 Postprocedural hypothyroidism: Secondary | ICD-10-CM

## 2021-07-28 DIAGNOSIS — Z6834 Body mass index (BMI) 34.0-34.9, adult: Secondary | ICD-10-CM | POA: Insufficient documentation

## 2021-07-28 DIAGNOSIS — Q103 Other congenital malformations of eyelid: Secondary | ICD-10-CM

## 2021-07-28 MED ORDER — LEVOTHYROXINE SODIUM 137 MCG PO TABS
137.0000 ug | ORAL_TABLET | Freq: Every day | ORAL | 3 refills | Status: DC
  Filled 2021-07-28 – 2021-08-25 (×2): qty 90, 90d supply, fill #0
  Filled 2021-11-23: qty 90, 90d supply, fill #1
  Filled 2022-02-23 – 2022-03-06 (×2): qty 90, 90d supply, fill #2

## 2021-07-28 NOTE — Progress Notes (Signed)
? ?Acute Office Visit ? ?Subjective:  ? ? Patient ID: Joanne Hughes, female    DOB: [birth year], 46 y.o.   MRN: [id number] ? ?Chief Complaint  ?Patient presents with  ? Medication Refill  ?  Refill on Thyroid meds. Pt also has a small stye on her left eye  ? ? ?HPI ?Patient is in today for a follow up appointment. Requests a refill of levothyroxine; also states she has a "stye" on her left upper eyelid x 2 weeks; no pain; no redness. ? ?Past Medical History:  ?Diagnosis Date  ? Elevated LDL cholesterol level 10/30/2018  ? Hidradenitis   ? Hirsutism   ? tried spironolactone and vaniqa in past without success  ? History of elevated lipids   ? History of prediabetes   ? A1c 5.9% January 2017  ? Hypothyroid   ? IUD contraception   ? MRSA (methicillin resistant Staphylococcus aureus)   ? per medical record from [place]  ? PCOS (polycystic ovarian syndrome)   ? Vitamin D deficiency 11/03/2018  ? ? ?Past Surgical History:  ?Procedure Laterality Date  ? BREAST REDUCTION SURGERY  05/1997  ? CESAREAN SECTION    ? CHOLECYSTECTOMY    ? gastric sleeve surgery  08/2015  ? surgeon Dr. Roanna Raider of [place]  ? REDUCTION MAMMAPLASTY Bilateral   ? THYROIDECTOMY  06/04/2013  ? surgeon Dr. Haywood Pao of [place]  ? ? ?Family History  ?Problem Relation Age of Onset  ? Hypertension Mother   ? Hypothyroidism Mother   ? Hypertension Father   ? Stroke Father   ? Diabetes Sister   ? Leukemia Sister   ? Breast cancer Cousin   ? ? ?Social History  ? ?Socioeconomic History  ? Marital status: Married  ?  Spouse name: Not on file  ? Number of children: Not on file  ? Years of education: Not on file  ? Highest education level: Not on file  ?Occupational History  ? Not on file  ?Tobacco Use  ? Smoking status: Never  ? Smokeless tobacco: Never  ?Vaping Use  ? Vaping Use: Never used  ?Substance and Sexual Activity  ? Alcohol use: Yes  ?  Alcohol/week: 2.0 standard drinks  ?  Types: 2 Glasses of wine per week  ? Drug use: No  ? Sexual activity: Yes  ?   Birth control/protection: I.U.D.  ?Other Topics Concern  ? Not on file  ?Social History Narrative  ? Not on file  ? ?Social Determinants of Health  ? ?Financial Resource Strain: Not on file  ?Food Insecurity: Not on file  ?Transportation Needs: Not on file  ?Physical Activity: Not on file  ?Stress: Not on file  ?Social Connections: Not on file  ?Intimate Partner Violence: Not on file  ? ? ?Outpatient Medications Prior to Visit  ?Medication Sig Dispense Refill  ? CALCIUM CITRATE PO Take 3 tablets by mouth daily.    ? Cholecalciferol (VITAMIN D3) 125 MCG (5000 UT) CAPS Take 5,000 each by mouth daily.    ? Cyanocobalamin (VITAMIN B-12 PO) Take by mouth.    ? ketoconazole (NIZORAL) 2 % shampoo Apply 1 application topically 2 (two) times a week. 120 mL 0  ? levonorgestrel (MIRENA) 20 MCG/24HR IUD 1 each by Intrauterine route once.    ? levothyroxine (SYNTHROID) 137 MCG tablet Take 1 tablet (137 mcg total) by mouth daily before breakfast. 30 tablet 0  ? Multiple Vitamins-Minerals (CENTRUM SILVER 50+WOMEN PO) Take by mouth.    ?  meloxicam (MOBIC) 15 MG tablet Take 1 tablet (15 mg total) by mouth daily. (Patient not taking: Reported on 07/28/2021) 30 tablet 0  ? ?No facility-administered medications prior to visit.  ? ? ?No Known Allergies ? ?Review of Systems  ?Constitutional:  Negative for activity change and chills.  ?HENT:  Negative for congestion and voice change.   ?Eyes:  Negative for pain and redness.  ?Respiratory:  Negative for cough and wheezing.   ?Cardiovascular:  Negative for chest pain.  ?Gastrointestinal:  Negative for constipation, diarrhea, nausea and vomiting.  ?Endocrine: Negative for polyuria.  ?Genitourinary:  Negative for frequency.  ?Skin:  Negative for color change and rash.  ?Allergic/Immunologic: Negative for immunocompromised state.  ?Neurological:  Negative for dizziness.  ?Psychiatric/Behavioral:  Negative for agitation.   ? ?   ?Objective:  ?  ?Physical Exam ?Vitals and nursing note reviewed.   ?Constitutional:   ?   General: She is not in acute distress. ?   Appearance: Normal appearance. She is not ill-appearing.  ?HENT:  ?   Head: Normocephalic and atraumatic.  ?   Right Ear: External ear normal.  ?   Left Ear: External ear normal.  ?   Nose: No congestion.  ?Eyes:  ?   Extraocular Movements: Extraocular movements intact.  ?   Conjunctiva/sclera: Conjunctivae normal.  ?   Pupils: Pupils are equal, round, and reactive to light.  ?Cardiovascular:  ?   Rate and Rhythm: Normal rate and regular rhythm.  ?   Pulses: Normal pulses.  ?   Heart sounds: Normal heart sounds.  ?Pulmonary:  ?   Effort: Pulmonary effort is normal.  ?   Breath sounds: Normal breath sounds. No wheezing.  ?Abdominal:  ?   General: Bowel sounds are normal.  ?   Palpations: Abdomen is soft.  ?Musculoskeletal:  ?   Cervical back: Normal range of motion and neck supple.  ?   Right lower leg: No edema.  ?   Left lower leg: No edema.  ?Skin: ?   General: Skin is warm and dry.  ?   Findings: No bruising.  ?Neurological:  ?   General: No focal deficit present.  ?   Mental Status: She is alert and oriented to person, place, and time.  ?Psychiatric:     ?   Mood and Affect: Mood normal.     ?   Behavior: Behavior normal.     ?   Thought Content: Thought content normal.  ? ? ?BP 130/90   Pulse 76   Wt 206 lb 12.8 oz (93.8 kg)   LMP 07/22/2021 (Exact Date)   SpO2 100%   BMI 34.41 kg/m?  ? ?Wt Readings from Last 3 Encounters:  ?07/28/21 206 lb 12.8 oz (93.8 kg)  ?06/16/21 204 lb (92.5 kg)  ?01/20/21 204 lb 9.6 oz (92.8 kg)  ? ? ?Health Maintenance Due  ?Topic Date Due  ? COVID-19 Vaccine (1) Never done  ? HIV Screening  Never done  ? Hepatitis C Screening  Never done  ? COLONOSCOPY (Pts 45-75yr Insurance coverage will need to be confirmed)  Never done  ? ? ?There are no preventive care reminders to display for this patient. ? ? ?Lab Results  ?Component Value Date  ? TSH 0.994 01/20/2021  ? ?Lab Results  ?Component Value Date  ? WBC 4.2  01/20/2021  ? HGB 11.8 01/20/2021  ? HCT 37.5 01/20/2021  ? MCV 89 01/20/2021  ? PLT 280 01/20/2021  ? ?Lab Results  ?  Component Value Date  ? NA 138 01/20/2021  ? K 4.4 01/20/2021  ? CO2 22 01/20/2021  ? GLUCOSE 84 01/20/2021  ? BUN 11 01/20/2021  ? CREATININE 0.71 01/20/2021  ? BILITOT 0.8 01/20/2021  ? ALKPHOS 70 01/20/2021  ? AST 15 01/20/2021  ? ALT 9 01/20/2021  ? PROT 7.1 01/20/2021  ? ALBUMIN 4.4 01/20/2021  ? CALCIUM 9.1 01/20/2021  ? ANIONGAP 11 06/16/2020  ? EGFR 106 01/20/2021  ? ?Lab Results  ?Component Value Date  ? CHOL 178 10/31/2018  ? ?Lab Results  ?Component Value Date  ? HDL 69 10/31/2018  ? ?Lab Results  ?Component Value Date  ? [place] 98 10/31/2018  ? ?Lab Results  ?Component Value Date  ? TRIG 54 10/31/2018  ? ?Lab Results  ?Component Value Date  ? CHOLHDL 2.6 10/31/2018  ? ?Lab Results  ?Component Value Date  ? HGBA1C 5.8 (H) 01/20/2020  ? ? ?   ?Assessment & Plan:  ? ?Problem List Items Addressed This Visit   ? ?  ? Endocrine  ? Postoperative hypothyroidism - Primary  ? ? ? ?No orders of the defined types were placed in this encounter. ? ?Patient advised to follow up with Op[facility] for left upper eyelid mass, rule out stye. ? ?Irene Pap, PA-C ? ?

## 2021-08-16 ENCOUNTER — Ambulatory Visit
Admission: RE | Admit: 2021-08-16 | Discharge: 2021-08-16 | Disposition: A | Discharge status: Home / Self Care | Payer: Commercial Managed Care - PPO | Source: Ambulatory Visit | Attending: Physician Assistant | Admitting: Physician Assistant

## 2021-08-16 ENCOUNTER — Ambulatory Visit: Payer: 59 | Admitting: Physician Assistant

## 2021-08-16 ENCOUNTER — Encounter: Payer: Self-pay | Admitting: Physician Assistant

## 2021-08-16 ENCOUNTER — Other Ambulatory Visit (HOSPITAL_COMMUNITY): Payer: Self-pay

## 2021-08-16 ENCOUNTER — Other Ambulatory Visit: Payer: Self-pay

## 2021-08-16 VITALS — BP 130/80 | HR 81 | Ht 65.0 in | Wt 210.2 lb

## 2021-08-16 DIAGNOSIS — M25572 Pain in left ankle and joints of left foot: Secondary | ICD-10-CM

## 2021-08-16 MED ORDER — METHYLPREDNISOLONE 4 MG PO TBPK
ORAL_TABLET | ORAL | 0 refills | Status: DC
  Filled 2021-08-16: qty 21, 6d supply, fill #0

## 2021-08-16 NOTE — Patient Instructions (Addendum)
You can walk in for an x-ray: ?Diagnostic Radiology and Imaging ? ?[facility] ?[address] ?[address] ?[place], [place] [address] ? ?Phone [phone] ?Fax [phone] ? ?Hours of Operation ?General hours of operation are Monday - Friday, 8 am-5 pm  ? ?Do not take anti-inflammatory medicine with Medrol DosePak like iburpofen, advil , motrin, or aleve ? ?You can take OTC Tylenol ?

## 2021-08-16 NOTE — Progress Notes (Signed)
? ?Acute Office Visit ? ?Subjective:  ? ? Patient ID: Joanne Hughes, female    DOB: 06-18-1975, 46 y.o.   MRN: [id number] ? ?Chief Complaint  ?Patient presents with  ? Ankle Pain  ?  Left ankle pain. Pt denies injury  ? ? ?HPI ?Patient is in today for pain on outside left ankle for 3 days; denies fall or injuries; denies redness or warmth, denies swelling and states she always has puffy ankles; no histoy of gout; this morning the ankle pain woke her up with throbbing, when she stood up to walk to the bathroom could not bear weight on the left foot; new exercise program doing planks x 3 weeks, but no other hiking or known injuries; ibuprofen helps the pain; is walking with walker due to the pain since this morning; for the past 3 days has been eating shrimp and drank some wine; has been eating shellfish, seafood, and drinking alcohol for years without joint pains; no known family history of gout. ? ? ? ?Outpatient Medications Prior to Visit  ?Medication Sig Dispense Refill  ? CALCIUM CITRATE PO Take 3 tablets by mouth daily.    ? Cholecalciferol (VITAMIN D3) 125 MCG (5000 UT) CAPS Take 5,000 each by mouth daily.    ? Cyanocobalamin (VITAMIN B-12 PO) Take by mouth.    ? ketoconazole (NIZORAL) 2 % shampoo Apply 1 application topically 2 (two) times a week. 120 mL 0  ? levonorgestrel (MIRENA) 20 MCG/24HR IUD 1 each by Intrauterine route once.    ? levothyroxine (SYNTHROID) 137 MCG tablet Take 1 tablet (137 mcg total) by mouth daily before breakfast. 90 tablet 3  ? Multiple Vitamins-Minerals (CENTRUM SILVER 50+WOMEN PO) Take by mouth.    ? meloxicam (MOBIC) 15 MG tablet Take 1 tablet (15 mg total) by mouth daily. (Patient not taking: Reported on 07/28/2021) 30 tablet 0  ? ?No facility-administered medications prior to visit.  ? ? ?No Known Allergies ? ?Review of Systems  ?Constitutional:  Negative for activity change and chills.  ?HENT:  Negative for congestion and voice change.   ?Eyes:  Negative for pain and redness.   ?Respiratory:  Negative for cough and wheezing.   ?Cardiovascular:  Negative for chest pain.  ?Gastrointestinal:  Negative for constipation, diarrhea, nausea and vomiting.  ?Endocrine: Negative for polyuria.  ?Genitourinary:  Negative for frequency.  ?Musculoskeletal:  Positive for arthralgias, gait problem and myalgias.  ?Skin:  Negative for color change and rash.  ?Allergic/Immunologic: Negative for immunocompromised state.  ?Neurological:  Negative for dizziness.  ?Psychiatric/Behavioral:  Negative for agitation.   ? ?   ?Objective:  ?  ?Physical Exam ?Vitals and nursing note reviewed.  ?Constitutional:   ?   General: She is not in acute distress. ?   Appearance: Normal appearance. She is not ill-appearing.  ?HENT:  ?   Head: Normocephalic and atraumatic.  ?   Right Ear: External ear normal.  ?   Left Ear: External ear normal.  ?   Nose: No congestion.  ?Eyes:  ?   Extraocular Movements: Extraocular movements intact.  ?   Conjunctiva/sclera: Conjunctivae normal.  ?   Pupils: Pupils are equal, round, and reactive to light.  ?Cardiovascular:  ?   Pulses: Normal pulses.  ?Pulmonary:  ?   Breath sounds: No wheezing.  ?Musculoskeletal:  ?   Cervical back: Normal range of motion and neck supple.  ?   Right lower leg: No edema.  ?   Left lower leg: Tenderness and  bony tenderness present. No swelling. No edema.  ?   Comments: Left ankle -  no warmth, no erythema, minimal edema, decreased ROM due to pain  ?Skin: ?   General: Skin is warm and dry.  ?   Findings: No bruising or erythema.  ?Neurological:  ?   General: No focal deficit present.  ?   Mental Status: She is alert and oriented to person, place, and time.  ?   Gait: Gait normal.  ?Psychiatric:     ?   Mood and Affect: Mood normal.     ?   Behavior: Behavior normal.     ?   Thought Content: Thought content normal.  ? ? ?BP 130/80   Pulse 81   Wt 210 lb 3.2 oz (95.3 kg)   LMP 07/22/2021 (Exact Date)   SpO2 100%   BMI 34.98 kg/m?  ? ?Wt Readings from Last 3  Encounters:  ?08/16/21 210 lb 3.2 oz (95.3 kg)  ?07/28/21 206 lb 12.8 oz (93.8 kg)  ?06/16/21 204 lb (92.5 kg)  ? ? ?Results for orders placed or performed in visit on 06/16/21  ?Cytology - PAP( [place])  ?Result Value Ref Range  ? High risk HPV Negative   ? Adequacy    ?  Satisfactory for evaluation; transformation zone component PRESENT.  ? Diagnosis    ?  - Negative for intraepithelial lesion or malignancy (NILM)  ? Comment Normal Reference Range HPV - Negative   ?Cervicovaginal ancillary only( [place])  ?Result Value Ref Range  ? Neisseria Gonorrhea Negative   ? Chlamydia Negative   ? Trichomonas Negative   ? Bacterial Vaginitis (gardnerella) Negative   ? Candida Vaginitis Negative   ? Candida Glabrata Negative   ? Comment    ?  Normal Reference Range Bacterial Vaginosis - Negative  ? Comment Normal Reference Range Candida Species - Negative   ? Comment Normal Reference Range Candida Galbrata - Negative   ? Comment Normal Reference Range Trichomonas - Negative   ? Comment Normal Reference [place] Chlamydia - Negative   ? Comment    ?  Normal Reference Range Neisseria Gonorrhea - Negative  ? ? ?   ?Assessment & Plan:  ?1. Acute left ankle pain ?- CBC with Differential/Platelet ?- Uric Acid ?- DG Ankle Complete Left; Future ? ? ? ?Meds ordered this encounter  ?Medications  ? methylPREDNISolone (MEDROL DOSEPAK) 4 MG TBPK tablet  ?  Sig: Use tapering dose as direted  ?  Dispense:  21 tablet  ?  Refill:  0  ?  Order Specific Question:   Supervising Provider  ?  Answer:   Denita Lung [[id number]]  ? ? ?Return for Return as Already Scheduled. ? ?Irene Pap, PA-C ?

## 2021-08-17 LAB — CBC WITH DIFFERENTIAL/PLATELET
Basophils Absolute: 0 10*3/uL (ref 0.0–0.2)
Basos: 0 %
EOS (ABSOLUTE): 0.1 10*3/uL (ref 0.0–0.4)
Eos: 1 %
Hematocrit: 37.9 % (ref 34.0–46.6)
Hemoglobin: 12.1 g/dL (ref 11.1–15.9)
Immature Grans (Abs): 0 10*3/uL (ref 0.0–0.1)
Immature Granulocytes: 0 %
Lymphocytes Absolute: 1.7 10*3/uL (ref 0.7–3.1)
Lymphs: 27 %
MCH: 28.3 pg (ref 26.6–33.0)
MCHC: 31.9 g/dL (ref 31.5–35.7)
MCV: 89 fL (ref 79–97)
Monocytes Absolute: 0.4 10*3/uL (ref 0.1–0.9)
Monocytes: 7 %
Neutrophils Absolute: 4.1 10*3/uL (ref 1.4–7.0)
Neutrophils: 65 %
Platelets: 291 10*3/uL (ref 150–450)
RBC: 4.28 x10E6/uL (ref 3.77–5.28)
RDW: 12.9 % (ref 11.7–15.4)
WBC: 6.2 10*3/uL (ref 3.4–10.8)

## 2021-08-17 LAB — URIC ACID: Uric Acid: 4.2 mg/dL (ref 2.6–6.2)

## 2021-08-25 ENCOUNTER — Other Ambulatory Visit (HOSPITAL_COMMUNITY): Payer: Self-pay

## 2021-11-18 ENCOUNTER — Encounter: Payer: Self-pay | Admitting: Internal Medicine

## 2021-11-23 ENCOUNTER — Other Ambulatory Visit (HOSPITAL_COMMUNITY): Payer: Self-pay

## 2021-11-24 ENCOUNTER — Encounter: Payer: 59 | Admitting: Physician Assistant

## 2021-11-24 NOTE — Progress Notes (Addendum)
Complete physical exam   Patient: Joanne Hughes   DOB: 15-Sep-1975   46 y.o. Female  MRN: [id number] Visit Date: 11/25/2021  Chief Complaint  Patient presents with   Annual Exam    None fasting CPE. Pap not due. Would like referral for Colonoscopy.    Subjective    Joanne Hughes is a 46 y.o. female who presents today for a complete physical exam.   Reports is generally feeling well; is eating a low salt, healthy diet; is sleeping well about 6 hours a night; drinks 1.5 bottles of water a day; is exercising by walking and works out at home with weights and bands   HPI HPI     Annual Exam    Additional comments: None fasting CPE. Pap not due. Would like referral for Colonoscopy.       Last edited by Deforest Hoyles, [place] on 11/25/2021  3:41 PM.        Past Medical History:  Diagnosis Date   Elevated LDL cholesterol level 10/30/2018   Hidradenitis    Hirsutism    tried spironolactone and vaniqa in past without success   History of elevated lipids    History of prediabetes    A1c 5.9% January 2017   Hypothyroid    IUD contraception    MRSA (methicillin resistant Staphylococcus aureus)    per medical record from [place]   PCOS (polycystic ovarian syndrome)    Vitamin D deficiency 11/03/2018   Past Surgical History:  Procedure Laterality Date   BREAST REDUCTION SURGERY  05/1997   CESAREAN SECTION     CHOLECYSTECTOMY     gastric sleeve surgery  08/2015   surgeon Dr. Roanna Raider of [place] Bilateral    THYROIDECTOMY  06/04/2013   surgeon Dr. Haywood Pao of [place]   Social History   Socioeconomic History   Marital status: Married    Spouse name: Not on file   Number of children: Not on file   Years of education: Not on file   Highest education level: Not on file  Occupational History   Not on file  Tobacco Use   Smoking status: Never   Smokeless tobacco: Never  Vaping Use   Vaping Use: Never used  Substance and Sexual Activity    Alcohol use: Yes    Alcohol/week: 2.0 standard drinks of alcohol    Types: 2 Glasses of wine per week   Drug use: No   Sexual activity: Yes    Birth control/protection: I.U.D.  Other Topics Concern   Not on file  Social History Narrative   Not on file   Social Determinants of Health   Financial Resource Strain: Not on file  Food Insecurity: Not on file  Transportation Needs: Not on file  Physical Activity: Not on file  Stress: Not on file  Social Connections: Not on file  Intimate Partner Violence: Not on file   Family Status  Relation Name Status   Mother  (Not Specified)   Father  (Not Specified)   Sister  (Not Specified)   Cousin  (Not Specified)   Family History  Problem Relation Age of Onset   Hypertension Mother    Hypothyroidism Mother    Hypertension Father    Stroke Father    Diabetes Sister    Leukemia Sister    Breast cancer Cousin    No Known Allergies  Patient Care Team: Marcellina Millin as PCP - General (Physician Assistant)  Medications: Outpatient Medications Prior to Visit  Medication Sig   CALCIUM CITRATE PO Take 3 tablets by mouth daily.   Cholecalciferol (VITAMIN D3) 125 MCG (5000 UT) CAPS Take 5,000 each by mouth daily.   Cyanocobalamin (VITAMIN B-12 PO) Take by mouth.   ketoconazole (NIZORAL) 2 % shampoo Apply 1 application topically 2 (two) times a week.   levonorgestrel (MIRENA) 20 MCG/24HR IUD 1 each by Intrauterine route once.   levothyroxine (SYNTHROID) 137 MCG tablet Take 1 tablet (137 mcg total) by mouth daily before breakfast.   Multiple Vitamins-Minerals (CENTRUM SILVER 50+WOMEN PO) Take by mouth.   [DISCONTINUED] meloxicam (MOBIC) 15 MG tablet Take 1 tablet (15 mg total) by mouth daily. (Patient not taking: Reported on 07/28/2021)   [DISCONTINUED] methylPREDNISolone (MEDROL DOSEPAK) 4 MG TBPK tablet Use tapering dose as direted (Patient not taking: Reported on 11/25/2021)   No facility-administered medications prior to visit.     Review of Systems  Constitutional:  Negative for activity change and fever.  HENT:  Negative for congestion, ear pain and voice change.   Eyes:  Negative for redness.  Respiratory:  Negative for cough.   Cardiovascular:  Negative for chest pain.  Gastrointestinal:  Negative for constipation and diarrhea.  Endocrine: Negative for polyuria.  Genitourinary:  Negative for flank pain.  Musculoskeletal:  Negative for gait problem and neck stiffness.  Skin:  Negative for color change and rash.  Neurological:  Negative for dizziness.  Hematological:  Negative for adenopathy.  Psychiatric/Behavioral:  Negative for agitation, behavioral problems and confusion.       The ASCVD Risk score (Arnett DK, et al., 2019) failed to calculate for the following reasons:   Cannot find a previous HDL lab   Cannot find a previous total cholesterol lab   Objective    BP 130/80   Pulse 78   Ht '5\' 5"'$  (1.651 m)   Wt 208 lb 6.4 oz (94.5 kg)   LMP 11/02/2021   SpO2 98%   BMI 34.68 kg/m      Physical Exam Vitals and nursing note reviewed.  Constitutional:      General: She is not in acute distress.    Appearance: Normal appearance. She is not ill-appearing.  HENT:     Head: Normocephalic and atraumatic.     Right Ear: Tympanic membrane, ear canal and external ear normal.     Left Ear: Tympanic membrane, ear canal and external ear normal.     Nose: No congestion.  Eyes:     Extraocular Movements: Extraocular movements intact.     Conjunctiva/sclera: Conjunctivae normal.     Pupils: Pupils are equal, round, and reactive to light.  Neck:     Vascular: No carotid bruit.  Cardiovascular:     Rate and Rhythm: Normal rate and regular rhythm.     Pulses: Normal pulses.     Heart sounds: Normal heart sounds.  Pulmonary:     Effort: Pulmonary effort is normal.     Breath sounds: Normal breath sounds. No wheezing.  Abdominal:     General: Bowel sounds are normal.     Palpations: Abdomen is  soft.  Musculoskeletal:        General: Normal range of motion.     Cervical back: Normal range of motion and neck supple.     Right lower leg: No edema.     Left lower leg: No edema.  Skin:    General: Skin is warm and dry.     Findings: No  bruising.  Neurological:     General: No focal deficit present.     Mental Status: She is alert and oriented to person, place, and time.  Psychiatric:        Mood and Affect: Mood normal.        Behavior: Behavior normal.        Thought Content: Thought content normal.       Last depression screening scores    11/25/2021    3:36 PM 08/16/2021   10:26 AM 07/28/2021    3:27 PM  PHQ 2/9 Scores  PHQ - 2 Score 0 0 0   Last fall risk screening    11/25/2021    3:36 PM  [place] in the past year? 0  Number falls in past yr: 0  Injury with Fall? 0  Risk for fall due to : No Fall Risks  Follow up Falls evaluation completed     No results found for any visits on 11/25/21.  Assessment & Plan    Routine Health Maintenance and Physical Exam  Exercise Activities and Dietary recommendations  Goals   None     Immunization History  Administered Date(s) Administered   Influenza,inj,Quad PF,6+ Mos 03/01/2017, 01/20/2021   Influenza-Unspecified 03/01/2016, 03/01/2017, 02/08/2018   PFIZER(Purple Top)SARS-COV-2 Vaccination 05/31/2019, 06/20/2019   Td 05/23/2007   Tdap 03/01/2016    Health Maintenance  Topic Date Due   COLONOSCOPY (Pts 45-74yr Insurance coverage will need to be confirmed)  Never done   COVID-19 Vaccine (3 - Pfizer series) 05/29/2022 (Originally 08/15/2019)   Hepatitis C Screening  11/27/2022 (Originally 10/15/1992)   HIV Screening  11/27/2022 (Originally 10/15/1989)   INFLUENZA VACCINE  12/20/2021   PAP SMEAR-Modifier  06/16/2024   TETANUS/TDAP  03/01/2026   HPV VACCINES  Aged Out    Discussed health benefits of physical activity, and encouraged her to engage in regular exercise appropriate for her age and  condition.  Problem List Items Addressed This Visit       Endocrine   Postoperative hypothyroidism    Stable, thyroid panel checked today      Relevant Orders   Thyroid Panel With TSH     Other   Elevated LDL cholesterol level    eat a low fat diet, increase fiber intake (Benefiber or Metamucil, Cherrios,  oatmeal, beans, nuts, fruits and vegetables), limit saturated fats (in fried foods, red meat), can add OTC fish oil supplement, eat fish with Omega-3 fatty acids like salmon and tuna, exercise for 30 minutes 3 - 5 times a week, drink 8 - 10 glasses of water a day, lipid panel ordered        Relevant Orders   Lipid panel   Prediabetes    eat a low sugar diet, avoid starchy food with a lot of carbohydrates, avoid fried and processed foods; hgb a1c ordered       Relevant Orders   Comprehensive metabolic panel   Other Visit Diagnoses     Routine medical exam    -  Primary   Relevant Orders   Comprehensive metabolic panel   CBC with Differential/Platelet   Lipid panel   Thyroid Panel With TSH   Colon cancer screening       Relevant Orders   Ambulatory referral to Gastroenterology        Return in about 1 year (around 11/26/2022) for Return for Annual Exam.     LIrene Pap PA-C

## 2021-11-25 ENCOUNTER — Telehealth: Payer: Self-pay | Admitting: Internal Medicine

## 2021-11-25 ENCOUNTER — Encounter: Payer: Self-pay | Admitting: Physician Assistant

## 2021-11-25 ENCOUNTER — Ambulatory Visit (INDEPENDENT_AMBULATORY_CARE_PROVIDER_SITE_OTHER): Payer: 59 | Admitting: Physician Assistant

## 2021-11-25 VITALS — BP 130/80 | HR 78 | Ht 65.0 in | Wt 208.4 lb

## 2021-11-25 DIAGNOSIS — Z0000 Encounter for general adult medical examination without abnormal findings: Secondary | ICD-10-CM

## 2021-11-25 DIAGNOSIS — E890 Postprocedural hypothyroidism: Secondary | ICD-10-CM

## 2021-11-25 DIAGNOSIS — R7303 Prediabetes: Secondary | ICD-10-CM | POA: Insufficient documentation

## 2021-11-25 DIAGNOSIS — Z1211 Encounter for screening for malignant neoplasm of colon: Secondary | ICD-10-CM

## 2021-11-25 DIAGNOSIS — E7800 Pure hypercholesterolemia, unspecified: Secondary | ICD-10-CM

## 2021-11-25 NOTE — Assessment & Plan Note (Signed)
eat a low fat diet, increase fiber intake (Benefiber or Metamucil, Cherrios,  oatmeal, beans, nuts, fruits and vegetables), limit saturated fats (in fried foods, red meat), can add OTC fish oil supplement, eat fish with Omega-3 fatty acids like salmon and tuna, exercise for 30 minutes 3 - 5 times a week, drink 8 - 10 glasses of water a day, lipid panel ordered

## 2021-11-25 NOTE — Telephone Encounter (Signed)
Patient wants to know if she can switch to you. You just took her husband [employer] as a patient and wants to know if you will take her as well. She forgot to ask you at his appointment recently about her. She is due for CPE next year and wanted to schedule this.

## 2021-11-25 NOTE — Assessment & Plan Note (Signed)
Stable, thyroid panel checked today

## 2021-11-25 NOTE — Patient Instructions (Addendum)
You will get a call to schedule an appointment with GI for a colonoscopy  Preventative Care for Adults - Female      MAINTAIN REGULAR HEALTH EXAMS: A routine yearly physical is a good way to check in with your primary care provider about your health and preventive screening. It is also an opportunity to share updates about your health and any concerns you have, and receive a thorough all-over exam.  Most health insurance companies pay for at least some preventative services.  Check with your health plan for specific coverages.  WHAT PREVENTATIVE SERVICES DO WOMEN NEED? Adult women should have their weight and blood pressure checked regularly.  Women age 62 and older should have their cholesterol levels checked regularly. Women should be screened for cervical cancer with a Pap smear and pelvic exam beginning at either age 20, or 3 years after they become sexually activity.   Breast cancer screening generally begins at age 73 with a mammogram and breast exam by your primary care provider.   Beginning at age 33 and continuing to age 67, women should be screened for colorectal cancer.  Certain people may need continued testing until age 57. Updating vaccinations is part of preventative care.  Vaccinations help protect against diseases such as the flu. Osteoporosis is a disease in which the bones lose minerals and strength as we age. Women ages 61 and over should discuss this with their caregivers, as should women after menopause who have other risk factors. Lab tests are generally done as part of preventative care to screen for anemia and blood disorders, to screen for problems with the kidneys and liver, to screen for bladder problems, to check blood sugar, and to check your cholesterol level. Preventative services generally include counseling about diet, exercise, avoiding tobacco, drugs, excessive alcohol consumption, and sexually transmitted infections.    GENERAL RECOMMENDATIONS FOR GOOD  HEALTH:  Healthy diet: Eat a variety of foods, including fruit, vegetables, animal or vegetable protein, such as meat, fish, chicken, and eggs, or beans, lentils, tofu, and grains, such as rice. Drink plenty of water daily (60 - 80 ounces or 8 - 10 glasses a day) Decrease saturated fat in the diet, avoid lots of red meat, processed foods, sweets, fast foods, and fried foods. For high cholesterol - Increase fiber intake (Benefiber or Metamucil, Cherrios,  oatmeal, beans, nuts, fruits and vegetables), limit saturated fats (in fried foods, red meat), can add OTC fish oil supplement, eat fish with Omega-3 fatty acids like salmon and tuna, exercise for 30 minutes 3 - 5 times a week, drink 8 - 10 glasses of water a day  Exercise: Aerobic exercise helps maintain good heart health. At least 30-40 minutes of moderate-intensity exercise is recommended. For example, a brisk walk that increases your heart rate and breathing. This should be done on most days of the week.  Find a type of exercise or a variety of exercises that you enjoy so that it becomes a part of your daily life.  Examples are running, walking, swimming, water aerobics, and biking.  For motivation and support, explore group exercise such as aerobic class, spin class, Zumba, Yoga,or  martial arts, etc.   Set exercise goals for yourself, such as a certain weight goal, walk or run in a race such as a 5k walk/run.  Speak to your primary care provider about exercise goals.  Disease prevention: If you smoke or chew tobacco, find out from your caregiver how to quit. It can literally save your  life, no matter how long you have been a tobacco user. If you do not use tobacco, never begin.  Maintain a healthy diet and normal weight. Increased weight leads to problems with blood pressure and diabetes.  The Body Mass Index or BMI is a way of measuring how much of your body is fat. Having a BMI above 27 increases the risk of heart disease, diabetes,  hypertension, stroke and other problems related to obesity. Your caregiver can help determine your BMI and based on it develop an exercise and dietary program to help you achieve or maintain this important measurement at a healthful level. High blood pressure causes heart and blood vessel problems.  Persistent high blood pressure should be treated with medicine if weight loss and exercise do not work.  Fat and cholesterol leaves deposits in your arteries that can block them. This causes heart disease and vessel disease elsewhere in your body.  If your cholesterol is found to be high, or if you have heart disease or certain other medical conditions, then you may need to have your cholesterol monitored frequently and be treated with medication.  Ask if you should have a cardiac stress test if your history suggests this. A stress test is a test done on a treadmill that looks for heart disease. This test can find disease prior to there being a problem. Menopause can be associated with physical symptoms and risks. Hormone replacement therapy is available to decrease these. You should talk to your caregiver about whether starting or continuing to take hormones is right for you.  Osteoporosis is a disease in which the bones lose minerals and strength as we age. This can result in serious bone fractures. Risk of osteoporosis can be identified using a bone density scan. Women ages 71 and over should discuss this with their caregivers, as should women after menopause who have other risk factors. Ask your caregiver whether you should be taking a calcium supplement and Vitamin D, to reduce the rate of osteoporosis.  Avoid drinking alcohol in excess (more than two drinks per day).  Avoid use of street drugs. Do not share needles with anyone. Ask for professional help if you need assistance or instructions on stopping the use of alcohol, cigarettes, and/or drugs. Brush your teeth twice a day with fluoride toothpaste, and  floss once a day. Good oral hygiene prevents tooth decay and gum disease. The problems can be painful, unattractive, and can cause other health problems. Visit your dentist for a routine oral and dental check up and preventive care every 6-12 months.  Look at your skin regularly.  Use a mirror to look at your back. Notify your caregivers of changes in moles, especially if there are changes in shapes, colors, a size larger than a pencil eraser, an irregular border, or development of new moles.  Safety: Use seatbelts 100% of the time, whether driving or as a passenger.  Use safety devices such as hearing protection if you work in environments with loud noise or significant background noise.  Use safety glasses when doing any work that could send debris in to the eyes.  Use a helmet if you ride a bike or motorcycle.  Use appropriate safety gear for contact sports.  Talk to your caregiver about gun safety. Use sunscreen with a SPF (or skin protection factor) of 15 or greater.  Lighter skinned people are at a greater risk of skin cancer. Don't forget to also wear sunglasses in order to protect your eyes from  too much damaging sunlight. Damaging sunlight can accelerate cataract formation.  Practice safe sex. Use condoms. Condoms are used for birth control and to help reduce the spread of sexually transmitted infections (or STIs).  Some of the STIs are gonorrhea (the clap), chlamydia, syphilis, trichomonas, herpes, HPV (human papilloma virus) and HIV (human immunodeficiency virus) which causes AIDS. The herpes, HIV and HPV are viral illnesses that have no cure. These can result in disability, cancer and death.  Keep carbon monoxide and smoke detectors in your home functioning at all times. Change the batteries every 6 months or use a model that plugs into the wall.   Vaccinations: Stay up to date with your tetanus shots and other required immunizations. You should have a booster for tetanus every 10 years. Be  sure to get your flu shot every year, since 5%-20% of the [place] population comes down with the flu. The flu vaccine changes each year, so being vaccinated once is not enough. Get your shot in the fall, before the flu season peaks.   Other vaccines to consider: Human Papilloma Virus or HPV causes cancer of the cervix, and other infections that can be transmitted from person to person. There is a vaccine for HPV, and females should get immunized between the ages of 80 and 15. It requires a series of 3 shots.  Pneumococcal vaccine to protect against certain types of pneumonia.  This is normally recommended for adults age 28 or older.  However, adults younger than 47 years old with certain underlying conditions such as diabetes, heart or lung disease should also receive the vaccine. Shingles vaccine to protect against Varicella Zoster if you are older than age 46, or younger than 47 years old with certain underlying illness. Hepatitis A vaccine to protect against a form of infection of the liver by a virus acquired from food. Hepatitis B vaccine to protect against a form of infection of the liver by a virus acquired from blood or body fluids, particularly if you work in health care. If you plan to travel internationally, check with your local health department for specific vaccination recommendations.  Cancer Screening: Breast cancer screening is essential to preventive care for women. All women age 47 and older should perform a breast self-exam every month. At age 54 and older, women should have their caregiver complete a breast exam each year. Women at ages 55 and older should have a mammogram (x-ray film) of the breasts. Your caregiver can discuss how often you need mammograms.   Cervical cancer screening includes taking a Pap smear (sample of cells examined under a microscope) from the cervix (end of the uterus). It also includes testing for HPV (Human Papilloma Virus, which can cause cervical cancer).  Screening and a pelvic exam should begin at age 58, or 3 years after a woman becomes sexually active. Screening should occur every year, with a Pap smear but no HPV testing, up to age 11. After age 76, you should have a Pap smear every 3 years with HPV testing, if no HPV was found previously.  Most routine colon cancer screening begins at the age of 77. On a yearly basis, doctors may provide special easy to use take-home tests to check for hidden blood in the stool. Sigmoidoscopy or colonoscopy can detect the earliest forms of colon cancer and is life saving. These tests use a small camera at the end of a tube to directly examine the colon. Speak to your caregiver about this at age 62, when  routine screening begins (and is repeated every 5 years unless early forms of pre-cancerous polyps or small growths are found).

## 2021-11-25 NOTE — Assessment & Plan Note (Signed)
eat a low sugar diet, avoid starchy food with a lot of carbohydrates, avoid fried and processed foods; hgb a1c ordered

## 2021-11-26 NOTE — Telephone Encounter (Signed)
That's fine.  Her labs are scheduled for when I'm on vacation so any provider can look and respond to those, please.

## 2021-11-28 NOTE — Telephone Encounter (Signed)
Pt was notified.  

## 2021-12-01 ENCOUNTER — Other Ambulatory Visit: Payer: 59

## 2021-12-01 DIAGNOSIS — Z0000 Encounter for general adult medical examination without abnormal findings: Secondary | ICD-10-CM | POA: Diagnosis not present

## 2021-12-01 DIAGNOSIS — R7303 Prediabetes: Secondary | ICD-10-CM

## 2021-12-01 DIAGNOSIS — E890 Postprocedural hypothyroidism: Secondary | ICD-10-CM

## 2021-12-01 DIAGNOSIS — E7800 Pure hypercholesterolemia, unspecified: Secondary | ICD-10-CM | POA: Diagnosis not present

## 2021-12-02 LAB — CBC WITH DIFFERENTIAL/PLATELET
Basophils Absolute: 0 10*3/uL (ref 0.0–0.2)
Basos: 1 %
EOS (ABSOLUTE): 0.1 10*3/uL (ref 0.0–0.4)
Eos: 2 %
Hematocrit: 35.3 % (ref 34.0–46.6)
Hemoglobin: 11.5 g/dL (ref 11.1–15.9)
Immature Grans (Abs): 0 10*3/uL (ref 0.0–0.1)
Immature Granulocytes: 0 %
Lymphocytes Absolute: 1.6 10*3/uL (ref 0.7–3.1)
Lymphs: 33 %
MCH: 29 pg (ref 26.6–33.0)
MCHC: 32.6 g/dL (ref 31.5–35.7)
MCV: 89 fL (ref 79–97)
Monocytes Absolute: 0.3 10*3/uL (ref 0.1–0.9)
Monocytes: 6 %
Neutrophils Absolute: 2.9 10*3/uL (ref 1.4–7.0)
Neutrophils: 58 %
Platelets: 283 10*3/uL (ref 150–450)
RBC: 3.97 x10E6/uL (ref 3.77–5.28)
RDW: 13.1 % (ref 11.7–15.4)
WBC: 4.9 10*3/uL (ref 3.4–10.8)

## 2021-12-02 LAB — COMPREHENSIVE METABOLIC PANEL
ALT: 9 IU/L (ref 0–32)
AST: 15 IU/L (ref 0–40)
Albumin/Globulin Ratio: 1.5 (ref 1.2–2.2)
Albumin: 4.1 g/dL (ref 3.9–4.9)
Alkaline Phosphatase: 82 IU/L (ref 44–121)
BUN/Creatinine Ratio: 15 (ref 9–23)
BUN: 11 mg/dL (ref 6–24)
Bilirubin Total: 0.7 mg/dL (ref 0.0–1.2)
CO2: 24 mmol/L (ref 20–29)
Calcium: 9.3 mg/dL (ref 8.7–10.2)
Chloride: 105 mmol/L (ref 96–106)
Creatinine, Ser: 0.75 mg/dL (ref 0.57–1.00)
Globulin, Total: 2.8 g/dL (ref 1.5–4.5)
Glucose: 84 mg/dL (ref 70–99)
Potassium: 4.7 mmol/L (ref 3.5–5.2)
Sodium: 142 mmol/L (ref 134–144)
Total Protein: 6.9 g/dL (ref 6.0–8.5)
eGFR: 99 mL/min/{1.73_m2} (ref >59)

## 2021-12-02 LAB — LIPID PANEL
Chol/HDL Ratio: 2.7 ratio (ref 0.0–4.4)
Cholesterol, Total: 217 mg/dL — ABNORMAL HIGH (ref 100–199)
HDL: 79 mg/dL (ref >39)
LDL Chol Calc (NIH): 130 mg/dL — ABNORMAL HIGH (ref 0–99)
Triglycerides: 48 mg/dL (ref 0–149)
VLDL Cholesterol Cal: 8 mg/dL (ref 5–40)

## 2021-12-02 LAB — THYROID PANEL WITH TSH
Free Thyroxine Index: 1.6 (ref 1.2–4.9)
T3 Uptake Ratio: 23 % — ABNORMAL LOW (ref 24–39)
T4, Total: 6.9 ug/dL (ref 4.5–12.0)
TSH: 1.47 u[IU]/mL (ref 0.450–4.500)

## 2021-12-09 ENCOUNTER — Encounter: Payer: Self-pay | Admitting: Gastroenterology

## 2021-12-14 ENCOUNTER — Other Ambulatory Visit (HOSPITAL_COMMUNITY): Payer: Self-pay

## 2021-12-30 ENCOUNTER — Ambulatory Visit (AMBULATORY_SURGERY_CENTER): Payer: Self-pay

## 2021-12-30 VITALS — Ht 65.0 in | Wt 207.0 lb

## 2021-12-30 DIAGNOSIS — Z1211 Encounter for screening for malignant neoplasm of colon: Secondary | ICD-10-CM

## 2021-12-30 NOTE — Progress Notes (Signed)
No egg or soy allergy known to patient  No issues known to pt with past sedation with any surgeries or procedures (+PONV) Patient denies ever being told they had issues or difficulty with intubation  No FH of Malignant Hyperthermia Pt is not on diet pills Pt is not on  home 02  Pt is not on blood thinners  Pt denies issues with constipation  No A fib or A flutter Have any cardiac testing pending--denied Pt instructed to use [email or web address] or GoodRx for a price reduction on prep

## 2022-01-20 ENCOUNTER — Encounter: Payer: Self-pay | Admitting: Gastroenterology

## 2022-01-20 ENCOUNTER — Ambulatory Visit (AMBULATORY_SURGERY_CENTER): Payer: 59 | Admitting: Gastroenterology

## 2022-01-20 VITALS — BP 128/78 | HR 72 | Temp 97.5°F | Resp 11 | Ht 65.0 in | Wt 207.0 lb

## 2022-01-20 DIAGNOSIS — K635 Polyp of colon: Secondary | ICD-10-CM

## 2022-01-20 DIAGNOSIS — Z1211 Encounter for screening for malignant neoplasm of colon: Secondary | ICD-10-CM | POA: Diagnosis not present

## 2022-01-20 DIAGNOSIS — D124 Benign neoplasm of descending colon: Secondary | ICD-10-CM | POA: Diagnosis not present

## 2022-01-20 MED ORDER — SODIUM CHLORIDE 0.9 % IV SOLN: 500.0000 mL | INTRAVENOUS | Status: DC

## 2022-01-20 NOTE — Progress Notes (Signed)
Referring Provider: Rita Ohara, MD Primary Care Physician:  Rita Ohara, MD   Indication for Colonoscopy:  Colon cancer screening   IMPRESSION:  Need for colon cancer screening Appropriate candidate for monitored anesthesia care  PLAN: Colonoscopy in the [place] today   HPI: Joanne Hughes is a 46 y.o. female presents for screening colonoscopy.  No prior colonoscopy or colon cancer screening.  No known family history of colon cancer or polyps. No family history of uterine/endometrial cancer, pancreatic cancer or gastric/stomach cancer.   Past Medical History:  Diagnosis Date   Elevated LDL cholesterol level 10/30/2018   Hidradenitis    Hirsutism    tried spironolactone and vaniqa in past without success   History of elevated lipids    History of prediabetes    A1c 5.9% January 2017   Hypothyroid    IUD contraception    MRSA (methicillin resistant Staphylococcus aureus)    per medical record from [place]   PCOS (polycystic ovarian syndrome)    Sickle cell anemia ([place])    Vitamin D deficiency 11/03/2018    Past Surgical History:  Procedure Laterality Date   BREAST REDUCTION SURGERY  05/1997   CESAREAN SECTION     CHOLECYSTECTOMY     gastric sleeve surgery  08/2015   surgeon Dr. Roanna Raider of [place]    THYROIDECTOMY  06/04/2013   surgeon Dr. Haywood Pao of [place]    Current Outpatient Medications  Medication Sig Dispense Refill   CALCIUM CITRATE PO Take 3 tablets by mouth daily.     levonorgestrel (MIRENA) 20 MCG/24HR IUD 1 each by Intrauterine route once.     levothyroxine (SYNTHROID) 137 MCG tablet Take 1 tablet (137 mcg total) by mouth daily before breakfast. 90 tablet 3   Multiple Vitamins-Minerals (CENTRUM SILVER 50+WOMEN PO) Take by mouth.     ketoconazole (NIZORAL) 2 % shampoo Apply 1 application topically 2 (two) times a week. 120 mL 0   Current Facility-Administered Medications  Medication Dose Route Frequency  Provider Last Rate Last Admin   0.9 %  sodium chloride infusion  500 mL Intravenous Continuous Thornton Park, MD        Allergies as of 01/20/2022   (No Known Allergies)    Family History  Problem Relation Age of Onset   Hypertension Mother    Hypothyroidism Mother    Hypertension Father    Stroke Father    Diabetes Sister    Leukemia Sister    Breast cancer Cousin    Colon cancer Neg Hx    Colon polyps Neg Hx    Esophageal cancer Neg Hx    Rectal cancer Neg Hx    Stomach cancer Neg Hx      Physical Exam: General:   Alert,  well-nourished, pleasant and cooperative in NAD Head:  Normocephalic and atraumatic. Eyes:  Sclera clear, no icterus.   Conjunctiva pink. Mouth:  No deformity or lesions.   Neck:  Supple; no masses or thyromegaly. Lungs:  Clear throughout to auscultation.   No wheezes. Heart:  Regular rate and rhythm; no murmurs. Abdomen:  Soft, non-tender, nondistended, normal bowel sounds, no rebound or guarding.  Msk:  Symmetrical. No boney deformities LAD: No inguinal or umbilical LAD Extremities:  No clubbing or edema. Neurologic:  Alert and  oriented x4;  grossly nonfocal Skin:  No obvious rash or bruise. Psych:  Alert and cooperative. Normal mood and affect.     Studies/Results: No results found.  Kamaljit Hizer L. Tarri Glenn, MD, MPH 01/20/2022, 11:38 AM

## 2022-01-20 NOTE — Op Note (Signed)
[facility] Patient Name: Joanne Hughes Procedure Date: 01/20/2022 12:12 PM MRN: [id number] Endoscopist: Thornton Park MD, MD Age: 46 Referring MD:  Date of Birth: 01/16/1976 Gender: Female Account #: [id number] Procedure:                Colonoscopy Indications:              Screening for colorectal malignant neoplasm, This                            is the patient's first colonoscopy                           No known family history of colon cancer or polyps Medicines:                Monitored Anesthesia Care Procedure:                Pre-Anesthesia Assessment:                           - Prior to the procedure, a History and Physical                            was performed, and patient medications and                            allergies were reviewed. The patient's tolerance of                            previous anesthesia was also reviewed. The risks                            and benefits of the procedure and the sedation                            options and risks were discussed with the patient.                            All questions were answered, and informed consent                            was obtained. Prior Anticoagulants: The patient has                            taken no previous anticoagulant or antiplatelet                            agents. ASA Grade Assessment: II - A patient with                            mild systemic disease. After reviewing the risks                            and benefits, the patient was deemed in  satisfactory condition to undergo the procedure.                           After obtaining informed consent, the colonoscope                            was passed under direct vision. Throughout the                            procedure, the patient's blood pressure, pulse, and                            oxygen saturations were monitored continuously. The                            Olympus CF-HQ190L  ([id number]) Colonoscope was                            introduced through the anus and advanced to the the                            cecum, identified by appendiceal orifice and                            ileocecal valve. The colonoscopy was performed with                            moderate difficulty due to significant looping and                            a tortuous colon. A second forward view of the                            right colon was performed. Successful completion of                            the procedure was aided by changing the patient's                            position, withdrawing and reinserting the scope and                            applying abdominal pressure. The patient tolerated                            the procedure well. The quality of the bowel                            preparation was good. The terminal ileum, ileocecal                            valve, appendiceal orifice, and rectum were  photographed. Scope In: 12:17:04 PM Scope Out: 12:34:13 PM Scope Withdrawal Time: 0 hours 12 minutes 20 seconds  Total Procedure Duration: 0 hours 17 minutes 9 seconds  Findings:                 The perianal and digital rectal examinations were                            normal.                           A 2 mm polyp was found in the descending colon. The                            polyp was flat. The polyp was removed with a cold                            snare. Resection and retrieval were complete.                            Estimated blood loss was minimal.                           The colon (entire examined portion) was moderately                            tortuous.                           The exam was otherwise without abnormality on                            direct and retroflexion views. Complications:            No immediate complications. Estimated Blood Loss:     Estimated blood loss was minimal. Impression:                - One 2 mm polyp in the descending colon, removed                            with a cold snare. Resected and retrieved.                           - Tortuous colon.                           - The examination was otherwise normal on direct                            and retroflexion views. Recommendation:           - Patient has a contact number available for                            emergencies. The signs and symptoms of potential  delayed complications were discussed with the                            patient. Return to normal activities tomorrow.                            Written discharge instructions were provided to the                            patient.                           - Resume previous diet.                           - Continue present medications.                           - Await pathology results.                           - Repeat colonoscopy date to be determined after                            pending pathology results are reviewed for                            surveillance.                           - Emerging evidence supports eating a diet of                            fruits, vegetables, grains, calcium, and yogurt                            while reducing red meat and alcohol may reduce the                            risk of colon cancer.                           - Thank you for allowing me to be involved in your                            colon cancer prevention. Thornton Park MD, MD 01/20/2022 12:39:58 PM This report has been signed electronically.

## 2022-01-20 NOTE — Progress Notes (Signed)
PT taken to PACU. Monitors in place. VSS. Report given to RN. 

## 2022-01-20 NOTE — Progress Notes (Signed)
Called to room to assist during endoscopic procedure.  Patient ID and intended procedure confirmed with present staff. Received instructions for my participation in the procedure from the performing physician.  

## 2022-01-20 NOTE — Patient Instructions (Addendum)
YOU HAD AN ENDOSCOPIC PROCEDURE TODAY AT THE [facility]:   Refer to the procedure report that was given to you for any specific questions about what was found during the examination.  If the procedure report does not answer your questions, please call your gastroenterologist to clarify.  If you requested that your care partner not be given the details of your procedure findings, then the procedure report has been included in a sealed envelope for you to review at your convenience later.  YOU SHOULD EXPECT: Some feelings of bloating in the abdomen. Passage of more gas than usual.  Walking can help get rid of the air that was put into your GI tract during the procedure and reduce the bloating. If you had a lower endoscopy (such as a colonoscopy or flexible sigmoidoscopy) you may notice spotting of blood in your stool or on the toilet paper. If you underwent a bowel prep for your procedure, you may not have a normal bowel movement for a few days.  Please Note:  You might notice some irritation and congestion in your nose or some drainage.  This is from the oxygen used during your procedure.  There is no need for concern and it should clear up in a day or so.  SYMPTOMS TO REPORT IMMEDIATELY:  Following lower endoscopy (colonoscopy or flexible sigmoidoscopy):  Excessive amounts of blood in the stool  Significant tenderness or worsening of abdominal pains  Swelling of the abdomen that is new, acute  Fever of 100F or higher  For urgent or emergent issues, a gastroenterologist can be reached at any hour by calling [phone]. Do not use MyChart messaging for urgent concerns.    DIET:  We do recommend a small meal at first, but then you may proceed to your regular diet.  Drink plenty of fluids but you should avoid alcoholic beverages for 24 hours.  ACTIVITY:  You should plan to take it easy for the rest of today and you should NOT DRIVE or use heavy machinery until tomorrow (because of  the sedation medicines used during the test).    FOLLOW UP: Our staff will call the number listed on your records the next business day following your procedure.  We will call around 7:15- 8:00 am to check on you and address any questions or concerns that you may have regarding the information given to you following your procedure. If we do not reach you, we will leave a message.  If you develop any symptoms (ie: fever, flu-like symptoms, shortness of breath, cough etc.) before then, please call [phone].  If you test positive for Covid 19 in the 2 weeks post procedure, please call and report this information to us.    If any biopsies were taken you will be contacted by phone or by letter within the next 1-3 weeks.  Please call us at [phone] if you have not heard about the biopsies in 3 weeks.    SIGNATURES/CONFIDENTIALITY: You and/or your care partner have signed paperwork which will be entered into your electronic medical record.  These signatures attest to the fact that that the information above on your After Visit Summary has been reviewed and is understood.  Full responsibility of the confidentiality of this discharge information lies with you and/or your care-partner.  

## 2022-01-20 NOTE — Progress Notes (Signed)
Pt's states no medical or surgical changes since previsit or office visit. 

## 2022-01-24 ENCOUNTER — Telehealth: Payer: Self-pay | Admitting: *Deleted

## 2022-01-24 NOTE — Telephone Encounter (Signed)
  Follow up Call-     01/20/2022   10:55 AM  Call back number  Post procedure Call Back phone  # [phone]  Permission to leave phone message Yes     Patient questions:  Message left to call us if necessary.

## 2022-01-25 ENCOUNTER — Encounter: Payer: Self-pay | Admitting: Internal Medicine

## 2022-02-23 ENCOUNTER — Other Ambulatory Visit (HOSPITAL_COMMUNITY): Payer: Self-pay

## 2022-02-28 ENCOUNTER — Encounter: Payer: Self-pay | Admitting: Internal Medicine

## 2022-03-03 ENCOUNTER — Other Ambulatory Visit (HOSPITAL_COMMUNITY): Payer: Self-pay

## 2022-03-06 ENCOUNTER — Other Ambulatory Visit (HOSPITAL_COMMUNITY): Payer: Self-pay

## 2022-03-20 ENCOUNTER — Telehealth: Payer: Self-pay

## 2022-03-20 NOTE — Telephone Encounter (Signed)
Checking for path letter for colonoscopy from 01/20/22

## 2022-04-10 ENCOUNTER — Encounter: Payer: Self-pay | Admitting: Family Medicine

## 2022-04-25 DIAGNOSIS — H40013 Open angle with borderline findings, low risk, bilateral: Secondary | ICD-10-CM | POA: Diagnosis not present

## 2022-06-16 ENCOUNTER — Telehealth: Payer: Self-pay

## 2022-06-16 NOTE — Telephone Encounter (Signed)
Hi Dr Tarri Glenn,  I do not see any results letter or results note sent to this patient? Thank you,  Etheleen Nicks RN

## 2022-07-02 ENCOUNTER — Encounter: Payer: Self-pay | Admitting: Gastroenterology

## 2022-07-02 NOTE — Telephone Encounter (Signed)
Letter created

## 2022-07-26 ENCOUNTER — Other Ambulatory Visit (HOSPITAL_COMMUNITY): Payer: Self-pay

## 2022-07-26 ENCOUNTER — Encounter: Payer: Self-pay | Admitting: Family Medicine

## 2022-07-26 ENCOUNTER — Ambulatory Visit (INDEPENDENT_AMBULATORY_CARE_PROVIDER_SITE_OTHER): Payer: 59 | Admitting: Family Medicine

## 2022-07-26 VITALS — BP 124/78 | HR 64 | Ht 65.0 in | Wt 210.0 lb

## 2022-07-26 DIAGNOSIS — M62838 Other muscle spasm: Secondary | ICD-10-CM | POA: Diagnosis not present

## 2022-07-26 DIAGNOSIS — M542 Cervicalgia: Secondary | ICD-10-CM | POA: Diagnosis not present

## 2022-07-26 MED ORDER — MELOXICAM 15 MG PO TABS
15.0000 mg | ORAL_TABLET | Freq: Every day | ORAL | 0 refills | Status: DC
  Filled 2022-07-26: qty 30, 30d supply, fill #0

## 2022-07-26 MED ORDER — CYCLOBENZAPRINE HCL 5 MG PO TABS
5.0000 mg | ORAL_TABLET | Freq: Every day | ORAL | 0 refills | Status: DC
  Filled 2022-07-26: qty 20, 20d supply, fill #0

## 2022-07-26 NOTE — Patient Instructions (Addendum)
Stop the ibuprofen. Take meloxicam once daily with food instead. Use this once daily until your pain has resolved (can take it for up to 2 weeks; if you truly aren't better, you can take it for the full month, but also start physical therapy).  I gave you #30 so that you will have some leftover on hand for if/when pain recurs.  Avoid all ibuprofen/advil/motrin/aleve/naproxen/Goody and BC powder. The only additional medication you can take for pain is Tylenol (acetaminophen products).  Use the flexeril at bedtime--this is a muscle relaxant that may help you sleep.    Continue with heat and/or ice (sometimes alternating works the best). Do neck stretches frequently throughout the day.  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  Next step, if you aren't improving, would be a course of physical therapy.  Just let us know and we can put in a referral to the [facility] street PT location.

## 2022-07-26 NOTE — Progress Notes (Signed)
Chief Complaint  Patient presents with   Spasms    Neck spasms x 2 weeks. Has not had a good nights sleep in 2 weeks. Has been using heat, stretching, ibuprofen, ice and tried methocarbamol. Ice was the most relieving.     Patient presents with complaint of neck pain x 2 weeks.  She reports that she sits at a computer all day. (Different situations--sometimes at office, and sometimes at home, and also on a laptop). Has had problems with her neck in the past, recalls it involving her trapezius muscle. Previously required a shot and medication.  She typically uses heating pad ,ibuprofen, usually resolves. It hasn't been getting better, so presents for evaluation. She has never had the pain last this long. L side is most painful, very tight, pulling.  R also hurts. Not getting a good night sleep due to the pain. No radiation, no numbness/tingling.  No weakness  She has been taking 800 ibuprofen BID and heat for at least a week, and not improving. She took some of her husband's methocarbamol, which didn't help. She has been icing x 20 minutes, which has been most helpful. SalonPas with lidocaine gives some temporary help.   PMH, PSH, SH reviewed  Outpatient Encounter Medications as of 07/26/2022  Medication Sig Note   CALCIUM CITRATE PO Take 3 tablets by mouth daily. 07/26/2022: 500 each   cyclobenzaprine (FLEXERIL) 5 MG tablet Take 1 tablet (5 mg total) by mouth at bedtime.    ketoconazole (NIZORAL) 2 % shampoo Apply 1 application topically 2 (two) times a week.    levonorgestrel (MIRENA) 20 MCG/24HR IUD 1 each by Intrauterine route once.    levothyroxine (SYNTHROID) 137 MCG tablet Take 1 tablet (137 mcg total) by mouth daily before breakfast.    meloxicam (MOBIC) 15 MG tablet Take 1 tablet (15 mg total) by mouth daily with food for up to 2 weeks, then take as needed    Multiple Vitamins-Minerals (CENTRUM SILVER 50+WOMEN PO) Take by mouth.    [DISCONTINUED] ibuprofen (ADVIL) 800 MG tablet  Take 800 mg by mouth every 8 (eight) hours as needed. 07/26/2022: Last dose was 10am yesterday   No facility-administered encounter medications on file as of 07/26/2022.   NOT taking meloxicam or flexeril prior to today's visit  No Known Allergies  ROS: no fever, chills, URI symptoms.  No numbness, tingling, weakness.  No rash, bleeding/bruising or abdominal pain.  See HPI re: neck pain +work stress   PHYSICAL EXAM:  BP 124/78   Pulse 64   Ht '5\' 5"'$  (1.651 m)   Wt 210 lb (95.3 kg)   BMI 34.95 kg/m   Wt Readings from Last 3 Encounters:  07/26/22 210 lb (95.3 kg)  01/20/22 207 lb (93.9 kg)  12/30/21 207 lb (93.9 kg)   Pleasant, well-appearing female in no distress HEENT: conjunctiva and sclera are clear, EOMI Neck: FROM, no spinal tenderness, no trigger points or significant spasm noted today. No lymphadenopathy, thyromegaly or mass Normal strength, sensation. DTR's 2+ and symmetric in UEs Psych: normal mood, affect, hygiene and grooming   ASSESSMENT/PLAN:  Neck pain - muscular; stretches and proper posture reviewed.  NSAID, muscle relaxant qHS.  PT if not improving. f/u if radicular sx develop - Plan: meloxicam (MOBIC) 15 MG tablet  Muscle spasm - Plan: cyclobenzaprine (FLEXERIL) 5 MG tablet   Stop the ibuprofen. Take meloxicam once daily with food instead. Avoid all ibuprofen/advil/motrin/aleve/naproxen/Goody and BC powder. The only additional medication you can take for pain is  Tylenol (acetaminophen products).  Use the flexeril at bedtime--this is a muscle relaxant that may help you sleep.    Continue with heat and/or ice (sometimes alternating works the best). Do neck stretches frequently throughout the day.  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  Next step, if you aren't improving, would be a course of physical therapy.  Just let us know and we can put in a referral to the [facility] street PT location.

## 2022-07-27 ENCOUNTER — Other Ambulatory Visit (HOSPITAL_COMMUNITY): Payer: Self-pay

## 2022-08-02 ENCOUNTER — Other Ambulatory Visit: Payer: Self-pay | Admitting: Family Medicine

## 2022-08-02 DIAGNOSIS — Z1231 Encounter for screening mammogram for malignant neoplasm of breast: Secondary | ICD-10-CM

## 2022-08-03 ENCOUNTER — Other Ambulatory Visit: Payer: Self-pay

## 2022-08-03 ENCOUNTER — Other Ambulatory Visit (HOSPITAL_COMMUNITY): Payer: Self-pay

## 2022-08-03 ENCOUNTER — Other Ambulatory Visit: Payer: Self-pay | Admitting: Family Medicine

## 2022-08-03 DIAGNOSIS — E890 Postprocedural hypothyroidism: Secondary | ICD-10-CM

## 2022-08-03 MED ORDER — LEVOTHYROXINE SODIUM 137 MCG PO TABS
137.0000 ug | ORAL_TABLET | Freq: Every day | ORAL | 0 refills | Status: DC
  Filled 2022-08-03: qty 90, 90d supply, fill #0

## 2022-08-09 ENCOUNTER — Other Ambulatory Visit (HOSPITAL_COMMUNITY): Payer: Self-pay

## 2022-08-14 ENCOUNTER — Encounter: Payer: Self-pay | Admitting: Family Medicine

## 2022-08-14 DIAGNOSIS — M62838 Other muscle spasm: Secondary | ICD-10-CM

## 2022-08-14 DIAGNOSIS — M542 Cervicalgia: Secondary | ICD-10-CM

## 2022-08-16 ENCOUNTER — Ambulatory Visit (HOSPITAL_COMMUNITY)
Admission: EM | Admit: 2022-08-16 | Discharge: 2022-08-16 | Disposition: A | Discharge status: Home / Self Care | Payer: 59 | Attending: Family Medicine | Admitting: Family Medicine

## 2022-08-16 ENCOUNTER — Ambulatory Visit: Payer: 59 | Attending: Family Medicine

## 2022-08-16 ENCOUNTER — Other Ambulatory Visit (HOSPITAL_COMMUNITY): Payer: Self-pay

## 2022-08-16 ENCOUNTER — Encounter (HOSPITAL_COMMUNITY): Payer: Self-pay

## 2022-08-16 ENCOUNTER — Other Ambulatory Visit: Payer: Self-pay

## 2022-08-16 DIAGNOSIS — M6281 Muscle weakness (generalized): Secondary | ICD-10-CM | POA: Insufficient documentation

## 2022-08-16 DIAGNOSIS — M62838 Other muscle spasm: Secondary | ICD-10-CM

## 2022-08-16 DIAGNOSIS — M542 Cervicalgia: Secondary | ICD-10-CM | POA: Diagnosis not present

## 2022-08-16 MED ORDER — DIAZEPAM 5 MG PO TABS
5.0000 mg | ORAL_TABLET | Freq: Two times a day (BID) | ORAL | 0 refills | Status: DC | PRN
  Filled 2022-08-16: qty 14, 7d supply, fill #0

## 2022-08-16 MED ORDER — PREDNISONE 20 MG PO TABS
40.0000 mg | ORAL_TABLET | Freq: Every day | ORAL | 0 refills | Status: DC
  Filled 2022-08-16: qty 14, 7d supply, fill #0

## 2022-08-16 NOTE — ED Triage Notes (Signed)
Pt states has been tx'd for muscle spasms for a while. States having a flare up since Sunday. States went to physical therapy today with no relief. States taking her flexeril, meloxicam, and bio freeze with no relief. C/o center of neck pain radiating down lt arm.

## 2022-08-16 NOTE — Therapy (Signed)
OUTPATIENT PHYSICAL THERAPY CERVICAL EVALUATION   Patient Name: Joanne Hughes MRN: [id number] DOB:1976/01/22, 47 y.o., female Today's Date: 08/16/2022  END OF SESSION:  PT End of Session - 08/16/22 1037     Visit Number 1    Number of Visits 9    Date for PT Re-Evaluation 10/11/22    Authorization Type [employer]    PT Start Time [phone]    PT Stop Time [phone]    PT Time Calculation (min) 40 min    Activity Tolerance Patient tolerated treatment well    Behavior During Therapy [facility] for tasks assessed/performed   tearful during subjective            Past Medical History:  Diagnosis Date   Elevated LDL cholesterol level 10/30/2018   Hidradenitis    Hirsutism    tried spironolactone and vaniqa in past without success   History of elevated lipids    History of prediabetes    A1c 5.9% January 2017   Hypothyroid    IUD contraception    MRSA (methicillin resistant Staphylococcus aureus)    per medical record from [place]   PCOS (polycystic ovarian syndrome)    Sickle cell anemia ([place])    Vitamin D deficiency 11/03/2018   Past Surgical History:  Procedure Laterality Date   BREAST REDUCTION SURGERY  05/1997   CESAREAN SECTION     CHOLECYSTECTOMY     gastric sleeve surgery  08/2015   surgeon Dr. Roanna Raider of [place] Bilateral    THYROIDECTOMY  06/04/2013   surgeon Dr. Haywood Pao of [place]   Patient Active Problem List   Diagnosis Date Noted   Prediabetes 11/25/2021   Body mass index (BMI) of 34.0 to 34.9 in adult 07/28/2021   Decreased libido 01/20/2020   Vitamin D deficiency 11/03/2018   Elevated LDL cholesterol level 10/30/2018   Acute pain of left knee 09/12/2016   Postoperative hypothyroidism 07/12/2016   History of PCOS 07/12/2016   IUD contraception 07/12/2016   Hirsutism    Hidradenitis    MRSA (methicillin resistant Staphylococcus aureus)     PCP: Rita Ohara, MD  REFERRING PROVIDER: Rita Ohara, MD  REFERRING DIAG:   M54.2 (ICD-10-CM) - Neck pain [phone] (ICD-10-CM) - Muscle spasm  THERAPY DIAG:  Cervicalgia - Plan: PT plan of care cert/re-cert  Muscle weakness (generalized) - Plan: PT plan of care cert/re-cert  Rationale for Evaluation and Treatment: Rehabilitation  ONSET DATE: 4 weeks  SUBJECTIVE:  SUBJECTIVE STATEMENT: Pt presents to PT with reports of severe L sided neck pain and discomfort for last 4 weeks. No MOI, pain has been slowly increasing. Pt notes symptoms of pain to L lateral UE, denies N/T and denies red flag items such as bowel.bladder changes or saddle anesthesia. Pt has been unable to work for the last few days due to severe pain.   Hand dominance: Right  PERTINENT HISTORY:  Sickle cell  PAIN:  Are you having pain?  Yes: NPRS scale: 8/10 Worst: 10/10 Pain location: left upper trap Pain description: sharp, tight Aggravating factors: work duties, turning Relieving factors: ice  PRECAUTIONS: None  WEIGHT BEARING RESTRICTIONS: No  FALLS:  Has patient fallen in last 6 months? No  LIVING ENVIRONMENT: Lives with: lives with their family Lives in: House/apartment  OCCUPATION: [employer]  PLOF: Independent  PATIENT GOALS: Pt would like to decrease neck pain in order to improve comfort work and driving  OBJECTIVE:   DIAGNOSTIC FINDINGS:  N/A  PATIENT SURVEYS:  FOTO: 41% function; 63% predicted  COGNITION: Overall cognitive status: Within functional limits for tasks assessed  SENSATION: WFL  POSTURE: rounded shoulders and forward head  PALPATION: TTP to L upper trap   CERVICAL ROM:   Active ROM A/PROM (deg) eval  Flexion   Extension   Right lateral flexion   Left lateral flexion   Right rotation 60  Left rotation 45 p!   (Blank rows = not  tested)  UPPER EXTREMITY MMT:  MMT Right eval Left eval  Shoulder flexion    Shoulder extension    Shoulder abduction    Shoulder adduction    Shoulder extension    Shoulder internal rotation    Shoulder external rotation    Middle trapezius 3/5 3/5  Lower trapezius 3/5 3/5  Elbow flexion    Elbow extension    Wrist flexion    Wrist extension    Wrist ulnar deviation    Wrist radial deviation    Wrist pronation    Wrist supination    Grip strength     (Blank rows = not tested)  CERVICAL SPECIAL TESTS:  Neck flexor muscle endurance test: 5 sec  TREATMENT:08/16/2022 [facility] Adult PT Treatment:                                                DATE: 08/16/2022 Therapeutic Exercise: Seated scap retraction x 5 Seated upper trap stretch x 30" L Seated bilateral ER RTB x 5 Supine chin tuck x 5 - 3" hold Manual Therapy: Skilled palpation of trigger points for TPDN Positional release to L upper trap Trigger Point Dry Needling Treatment: Pre-treatment instruction: Patient instructed on dry needling rationale, procedures, and possible side effects including pain during treatment (achy,cramping feeling), bruising, drop of blood, lightheadedness, nausea, sweating. Patient Consent Given: Yes Education handout provided: No Muscles treated: left upper trap  Needle size and number: .30x39mm x 2 Electrical stimulation performed: No Parameters: N/A Treatment response/outcome: Twitch response elicited and Palpable decrease in muscle tension Post-treatment instructions: Patient instructed to expect possible mild to moderate muscle soreness later today and/or tomorrow. Patient instructed in methods to reduce muscle soreness and to continue prescribed HEP. If patient was dry needled over the lung field, patient was instructed on signs and symptoms of pneumothorax and, however unlikely, to see immediate medical attention should they occur.  Patient was also educated on signs and symptoms of infection  and to seek medical attention should they occur. Patient verbalized understanding of these instructions and education.   PATIENT EDUCATION:  Education details: eval findings, FOTO, HEP, POC Person educated: Patient Education method: Explanation, Demonstration, and Handouts Education comprehension: verbalized understanding and returned demonstration  HOME EXERCISE PROGRAM: Access Code: [email or web address] URL: [email or web address] Date: 08/16/2022 Prepared by: Octavio Manns  Exercises - Seated Scapular Retraction  - 1 x daily - 7 x weekly - 2 sets - 10 reps - 3 sec hold - Seated Upper Trapezius Stretch (Mirrored)  - 1 x daily - 7 x weekly - 2 reps - 20-30 sec hold - Shoulder External Rotation and Scapular Retraction with Resistance  - 1 x daily - 7 x weekly - 3 sets - 10 reps - red band hold - Supine Chin Tuck  - 1 x daily - 7 x weekly - 2 sets - 10 reps - 3 sec hold  ASSESSMENT:  CLINICAL IMPRESSION: Patient is a 47 y.o. F who was seen today for physical therapy evaluation and treatment for acute neck pain and discomfort. Physical findings are consistent with MD impression as pt has decrease in cervical ROM and tenderness to palpation of supporting cervical musculature. Her FOTO score indicates she is operating below PLOF. Pt responded well to manual therapy and TPDN, noting slight decrease in pain and improved ROM post. She would benefit from skilled PT services working on improving strength and ROM with combination of exercise and manual therapy.    OBJECTIVE IMPAIRMENTS: decreased mobility, difficulty walking, decreased ROM, decreased strength, postural dysfunction, and pain.   ACTIVITY LIMITATIONS: carrying, lifting, sitting, and reach over head  PARTICIPATION LIMITATIONS: driving, shopping, community activity, occupation, and yard work  PERSONAL FACTORS: 1 comorbidity: Sickle cell  are also affecting patient's functional outcome.   REHAB POTENTIAL: Excellent  CLINICAL  DECISION MAKING: Stable/uncomplicated  EVALUATION COMPLEXITY: Low   GOALS: Goals reviewed with patient? No  SHORT TERM GOALS: Target date: 09/06/2022   Pt will be compliant and knowledgeable with initial HEP for improved comfort and carryover Baseline: initial HEP given  Goal status: INITIAL  2.  Pt will self report neck pain no greater than 6/10 for improved comfort and functional ability Baseline: 10/10 at worst Goal status: INITIAL   LONG TERM GOALS: Target date: 10/11/2022   Pt will improve FOTO function score to no less than 63% as proxy for functional improvement Baseline: 41% function Goal status: INITIAL   2.  Pt will self report neck pain no greater than 3/10 for improved comfort and functional ability Baseline: 10/10 at worst Goal status: INITIAL   3.  Pt will improve L cervical rotation to no less than 60 for improved comfort and functional ability with work and driving Baseline: 45 Goal status: INITIAL  4.  Pt will be able to perform work related duties not limited by pain for improved comfort and function Baseline: unable Goal status: INITIAL  PLAN:  PT FREQUENCY: 1x/week  PT DURATION: 8 weeks  PLANNED INTERVENTIONS: Therapeutic exercises, Therapeutic activity, Neuromuscular re-education, Balance training, Gait training, Patient/Family education, Self Care, Joint mobilization, Dry Needling, Electrical stimulation, Cryotherapy, Moist heat, Vasopneumatic device, Manual therapy, and Re-evaluation  PLAN FOR NEXT SESSION: assess HEP response, periscapular and DNF strengthening, TPDN and manual    Ward Chatters, PT 08/16/2022, 10:39 AM

## 2022-08-16 NOTE — ED Provider Notes (Signed)
[name]   [id number] 08/16/22 Arrival Time: [id number]  ASSESSMENT & PLAN:  1. Muscle spasms of neck    Is attending PT. Current muscle relaxer isn't helping much; pain is affecting sleep.  Trial of below: Discharge Medication List as of 08/16/2022  4:11 PM     START taking these medications   Details  diazepam (VALIUM) 5 MG tablet Take 1 tablet (5 mg total) by mouth every 12 (twelve) hours as needed for muscle spasms., Starting Wed 08/16/2022, Normal    predniSONE (DELTASONE) 20 MG tablet Take 2 tablets (40 mg total) by mouth daily., Starting Wed 08/16/2022, Normal       Activities as tolerated.  Recommend:  Follow-up Information     [facility].   Specialty: Urgent Care Why: If worsening or failing to improve as anticipated. Contact information: [place] [id number] [phone]                [place] Controlled Substances Registry consulted for this patient. I feel the risk/benefit ratio today is favorable for proceeding with this prescription for a controlled substance. Medication sedation precautions given.  Reviewed expectations re: course of current medical issues. Questions answered. Outlined signs and symptoms indicating need for more acute intervention. Patient verbalized understanding. After Visit Summary given.  SUBJECTIVE: History from: patient. Joanne Hughes is a 47 y.o. female who reports being treated for muscle spasms for a while. States having a flare up since Sunday. LEFT neck into LEFT trapezius mostly. States went to physical therapy today with no relief. States taking her flexeril, meloxicam, and bio freeze with no relief. C/o center of neck pain radiating down lt arm. No extremity sensation changes or weakness.  Denies HA/n/v/visual changes. Normal ambulation.  Past Surgical History:  Procedure Laterality Date   BREAST REDUCTION SURGERY  05/1997   CESAREAN SECTION      CHOLECYSTECTOMY     gastric sleeve surgery  08/2015   surgeon Dr. Roanna Raider of [place] Bilateral    THYROIDECTOMY  06/04/2013   surgeon Dr. Haywood Pao of [place]      OBJECTIVE:  Vitals:   08/16/22 1536  BP: (!) 153/96  Pulse: 82  Resp: 18  Temp: 98.2 F (36.8 C)  TempSrc: Oral  SpO2: 99%    General appearance: alert; no distress HEENT: [place]; AT Neck: supple with FROM; very TTP with muscle tension noted over L neck into L trapezius Resp: unlabored respirations Extremities: Moves extremities normally Skin: warm and dry; no visible rashes Neurologic: gait normal; normal sensation and strength of LUE Psychological: alert and cooperative; normal mood and affect  No Known Allergies  Past Medical History:  Diagnosis Date   Elevated LDL cholesterol level 10/30/2018   Hidradenitis    Hirsutism    tried spironolactone and vaniqa in past without success   History of elevated lipids    History of prediabetes    A1c 5.9% January 2017   Hypothyroid    IUD contraception    MRSA (methicillin resistant Staphylococcus aureus)    per medical record from [place]   PCOS (polycystic ovarian syndrome)    Sickle cell anemia ([place])    Vitamin D deficiency 11/03/2018   Social History   Socioeconomic History   Marital status: Married    Spouse name: Not on file   Number of children: Not on file   Years of education: Not on file   Highest education level:  Not on file  Occupational History   Not on file  Tobacco Use   Smoking status: Never   Smokeless tobacco: Never  Vaping Use   Vaping Use: Never used  Substance and Sexual Activity   Alcohol use: Yes    Alcohol/week: 2.0 standard drinks of alcohol    Types: 2 Glasses of wine per week   Drug use: No   Sexual activity: Yes    Birth control/protection: I.U.D.  Other Topics Concern   Not on file  Social History Narrative   Not on file   Social Determinants of Health   Financial Resource Strain:  Not on file  Food Insecurity: Not on file  Transportation Needs: Not on file  Physical Activity: Not on file  Stress: Not on file  Social Connections: Not on file   Family History  Problem Relation Age of Onset   Hypertension Mother    Hypothyroidism Mother    Hypertension Father    Stroke Father    Diabetes Sister    Leukemia Sister    Breast cancer Cousin    Colon cancer Neg Hx    Colon polyps Neg Hx    Esophageal cancer Neg Hx    Rectal cancer Neg Hx    Stomach cancer Neg Hx    Past Surgical History:  Procedure Laterality Date   BREAST REDUCTION SURGERY  05/1997   CESAREAN SECTION     CHOLECYSTECTOMY     gastric sleeve surgery  08/2015   surgeon Dr. Roanna Raider of [place] Bilateral    THYROIDECTOMY  06/04/2013   surgeon Dr. Haywood Pao of [place]       Vanessa Kick, MD 08/16/22 [phone]

## 2022-08-22 ENCOUNTER — Ambulatory Visit: Payer: 59 | Attending: Family Medicine | Admitting: Physical Therapy

## 2022-08-22 ENCOUNTER — Encounter: Payer: Self-pay | Admitting: Physical Therapy

## 2022-08-22 DIAGNOSIS — M6281 Muscle weakness (generalized): Secondary | ICD-10-CM | POA: Insufficient documentation

## 2022-08-22 DIAGNOSIS — M542 Cervicalgia: Secondary | ICD-10-CM | POA: Diagnosis not present

## 2022-08-22 NOTE — Therapy (Signed)
OUTPATIENT PHYSICAL THERAPY TREATMENT NOTE   Patient Name: Joanne Hughes MRN: [id number] DOB:24-Mar-1976, 47 y.o., female Today's Date: 08/23/2022  PCP: Rita Ohara, MD   REFERRING PROVIDER: Rita Ohara, MD   PT End of Session - 08/22/22 1738     Visit Number 2    Number of Visits 9    Date for PT Re-Evaluation 10/11/22    Authorization Type [employer]    PT Start Time [phone]    PT Stop Time 0620    PT Time Calculation (min) 40 min    Activity Tolerance --    Behavior During Therapy --             Past Medical History:  Diagnosis Date   Elevated LDL cholesterol level 10/30/2018   Hidradenitis    Hirsutism    tried spironolactone and vaniqa in past without success   History of elevated lipids    History of prediabetes    A1c 5.9% January 2017   Hypothyroid    IUD contraception    MRSA (methicillin resistant Staphylococcus aureus)    per medical record from [place]   PCOS (polycystic ovarian syndrome)    Sickle cell anemia    Vitamin D deficiency 11/03/2018   Past Surgical History:  Procedure Laterality Date   BREAST REDUCTION SURGERY  05/1997   CESAREAN SECTION     CHOLECYSTECTOMY     gastric sleeve surgery  08/2015   surgeon Dr. Roanna Raider of [place] Bilateral    THYROIDECTOMY  06/04/2013   surgeon Dr. Haywood Pao of [place]   Patient Active Problem List   Diagnosis Date Noted   Prediabetes 11/25/2021   Body mass index (BMI) of 34.0 to 34.9 in adult 07/28/2021   Decreased libido 01/20/2020   Vitamin D deficiency 11/03/2018   Elevated LDL cholesterol level 10/30/2018   Acute pain of left knee 09/12/2016   Postoperative hypothyroidism 07/12/2016   History of PCOS 07/12/2016   IUD contraception 07/12/2016   Hirsutism    Hidradenitis    MRSA (methicillin resistant Staphylococcus aureus)     THERAPY DIAG:  Cervicalgia  Muscle weakness (generalized)   Rationale for Evaluation and Treatment Rehabilitation  REFERRING  DIAG: M54.2 (ICD-10-CM) - Neck pain M62.838 (ICD-10-CM) - Muscle spasm  PERTINENT HISTORY: Sickle cell   PRECAUTIONS/RESTRICTIONS:   None   SUBJECTIVE:  Pt went to urgent care and was prescribed a prednisone dose pack and valium.  She now feels much better.  Her current pain is 4/10.  PAIN:  Are you having pain?  Yes: NPRS scale: 8/10 Worst: 10/10 Pain location: left upper trap Pain description: sharp, tight Aggravating factors: work duties, turning Relieving factors: ice  OBJECTIVE: (objective measures completed at initial evaluation unless otherwise dated)  DIAGNOSTIC FINDINGS:  N/A   PATIENT SURVEYS:  FOTO: 41% function; 63% predicted   COGNITION: Overall cognitive status: Within functional limits for tasks assessed   SENSATION: WFL   POSTURE: rounded shoulders and forward head   PALPATION: TTP to L upper trap     CERVICAL ROM:    Active ROM A/PROM (deg) eval  Flexion    Extension    Right lateral flexion    Left lateral flexion    Right rotation 60  Left rotation 45 p!   (Blank rows = not tested)   UPPER EXTREMITY MMT:   MMT Right eval Left eval  Shoulder flexion      Shoulder extension  Shoulder abduction      Shoulder adduction      Shoulder extension      Shoulder internal rotation      Shoulder external rotation      Middle trapezius 3/5 3/5  Lower trapezius 3/5 3/5  Elbow flexion      Elbow extension      Wrist flexion      Wrist extension      Wrist ulnar deviation      Wrist radial deviation      Wrist pronation      Wrist supination      Grip strength       (Blank rows = not tested)   CERVICAL SPECIAL TESTS:  Neck flexor muscle endurance test: 5 sec    [facility] Adult PT Treatment:                                                DATE: 08/22/2022 Therapeutic Exercise: Foam roller routine for thoracic mobility - including protraction/retraction (unilateral and bilateral), cc/cw circles, horizontal abduction, shoulder  flexion/ext alternating, shoulder abduction, thread the needle, and thoracic ext Bil shoulder abd - GTB - 2x10 Bil shoulder diagonals  - 2x10 - GTB Manual Therapy: Positional release to L upper trap STM L UT, LS, cervical paraspinals   [facility] Adult PT Treatment:                                                DATE: 08/16/2022 Therapeutic Exercise: Seated scap retraction x 5 Seated upper trap stretch x 30" L Seated bilateral ER RTB x 5 Supine chin tuck x 5 - 3" hold Manual Therapy: Skilled palpation of trigger points for TPDN Positional release to L upper trap Trigger Point Dry Needling Treatment: Pre-treatment instruction: Patient instructed on dry needling rationale, procedures, and possible side effects including pain during treatment (achy,cramping feeling), bruising, drop of blood, lightheadedness, nausea, sweating. Patient Consent Given: Yes Education handout provided: No Muscles treated: left upper trap  Needle size and number: .30x64mm x 2 Electrical stimulation performed: No Parameters: N/A Treatment response/outcome: Twitch response elicited and Palpable decrease in muscle tension Post-treatment instructions: Patient instructed to expect possible mild to moderate muscle soreness later today and/or tomorrow. Patient instructed in methods to reduce muscle soreness and to continue prescribed HEP. If patient was dry needled over the lung field, patient was instructed on signs and symptoms of pneumothorax and, however unlikely, to see immediate medical attention should they occur. Patient was also educated on signs and symptoms of infection and to seek medical attention should they occur. Patient verbalized understanding of these instructions and education.      HOME EXERCISE PROGRAM: Access Code: [email or web address] URL: [email or web address] Date: 08/16/2022 Prepared by: Octavio Manns   Exercises - Seated Scapular Retraction  - 1 x daily - 7 x weekly - 2 sets - 10 reps - 3 sec  hold - Seated Upper Trapezius Stretch (Mirrored)  - 1 x daily - 7 x weekly - 2 reps - 20-30 sec hold - Shoulder External Rotation and Scapular Retraction with Resistance  - 1 x daily - 7 x weekly - 3 sets - 10 reps - red band hold - Supine Chin Tuck  - 1  x daily - 7 x weekly - 2 sets - 10 reps - 3 sec hold  ASSESSMENT:   CLINICAL IMPRESSION: Pt doing much better today.  She reports significant pain reduction following therapy.  Will gradually increase exercise intensity/volume as tolerated.     OBJECTIVE IMPAIRMENTS: decreased mobility, difficulty walking, decreased ROM, decreased strength, postural dysfunction, and pain.    ACTIVITY LIMITATIONS: carrying, lifting, sitting, and reach over head   PARTICIPATION LIMITATIONS: driving, shopping, community activity, occupation, and yard work   PERSONAL FACTORS: 1 comorbidity: Sickle cell  are also affecting patient's functional outcome.    REHAB POTENTIAL: Excellent   CLINICAL DECISION MAKING: Stable/uncomplicated   EVALUATION COMPLEXITY: Low     GOALS: Goals reviewed with patient? No   SHORT TERM GOALS: Target date: 09/06/2022   Pt will be compliant and knowledgeable with initial HEP for improved comfort and carryover Baseline: initial HEP given  Goal status: INITIAL   2.  Pt will self report neck pain no greater than 6/10 for improved comfort and functional ability Baseline: 10/10 at worst Goal status: INITIAL    LONG TERM GOALS: Target date: 10/11/2022   Pt will improve FOTO function score to no less than 63% as proxy for functional improvement Baseline: 41% function Goal status: INITIAL    2.  Pt will self report neck pain no greater than 3/10 for improved comfort and functional ability Baseline: 10/10 at worst Goal status: INITIAL    3.  Pt will improve L cervical rotation to no less than 60 for improved comfort and functional ability with work and driving Baseline: 45 Goal status: INITIAL   4.  Pt will be able to  perform work related duties not limited by pain for improved comfort and function Baseline: unable Goal status: INITIAL   PLAN:   PT FREQUENCY: 1x/week   PT DURATION: 8 weeks   PLANNED INTERVENTIONS: Therapeutic exercises, Therapeutic activity, Neuromuscular re-education, Balance training, Gait training, Patient/Family education, Self Care, Joint mobilization, Dry Needling, Electrical stimulation, Cryotherapy, Moist heat, Vasopneumatic device, Manual therapy, and Re-evaluation   PLAN FOR NEXT SESSION: assess HEP response, periscapular and DNF strengthening, TPDN and manual   Kevan Ny Shann Merrick PT 08/23/2022, 8:26 AM

## 2022-09-01 ENCOUNTER — Ambulatory Visit: Payer: 59 | Admitting: Physical Therapy

## 2022-09-01 ENCOUNTER — Other Ambulatory Visit (HOSPITAL_COMMUNITY): Payer: Self-pay | Admitting: Family Medicine

## 2022-09-02 ENCOUNTER — Other Ambulatory Visit: Payer: Self-pay

## 2022-09-02 ENCOUNTER — Emergency Department (HOSPITAL_COMMUNITY): Payer: 59

## 2022-09-02 ENCOUNTER — Emergency Department (HOSPITAL_COMMUNITY)
Admission: EM | Admit: 2022-09-02 | Discharge: 2022-09-02 | Disposition: A | Discharge status: Home / Self Care | Payer: 59 | Attending: Emergency Medicine | Admitting: Emergency Medicine

## 2022-09-02 DIAGNOSIS — M5412 Radiculopathy, cervical region: Secondary | ICD-10-CM | POA: Insufficient documentation

## 2022-09-02 DIAGNOSIS — E039 Hypothyroidism, unspecified: Secondary | ICD-10-CM | POA: Insufficient documentation

## 2022-09-02 DIAGNOSIS — M19012 Primary osteoarthritis, left shoulder: Secondary | ICD-10-CM | POA: Diagnosis not present

## 2022-09-02 DIAGNOSIS — Z79899 Other long term (current) drug therapy: Secondary | ICD-10-CM | POA: Insufficient documentation

## 2022-09-02 DIAGNOSIS — M4802 Spinal stenosis, cervical region: Secondary | ICD-10-CM | POA: Diagnosis not present

## 2022-09-02 DIAGNOSIS — M542 Cervicalgia: Secondary | ICD-10-CM | POA: Diagnosis present

## 2022-09-02 LAB — CBC WITH DIFFERENTIAL/PLATELET
Abs Immature Granulocytes: 0.01 10*3/uL (ref 0.00–0.07)
Basophils Absolute: 0 10*3/uL (ref 0.0–0.1)
Basophils Relative: 0 %
Eosinophils Absolute: 0.1 10*3/uL (ref 0.0–0.5)
Eosinophils Relative: 1 %
HCT: 35.8 % — ABNORMAL LOW (ref 36.0–46.0)
Hemoglobin: 11.3 g/dL — ABNORMAL LOW (ref 12.0–15.0)
Immature Granulocytes: 0 %
Lymphocytes Relative: 25 %
Lymphs Abs: 1.4 10*3/uL (ref 0.7–4.0)
MCH: 28.3 pg (ref 26.0–34.0)
MCHC: 31.6 g/dL (ref 30.0–36.0)
MCV: 89.5 fL (ref 80.0–100.0)
Monocytes Absolute: 0.5 10*3/uL (ref 0.1–1.0)
Monocytes Relative: 8 %
Neutro Abs: 3.8 10*3/uL (ref 1.7–7.7)
Neutrophils Relative %: 66 %
Platelets: 265 10*3/uL (ref 150–400)
RBC: 4 MIL/uL (ref 3.87–5.11)
RDW: 14 % (ref 11.5–15.5)
WBC: 5.8 10*3/uL (ref 4.0–10.5)
nRBC: 0 % (ref 0.0–0.2)

## 2022-09-02 LAB — BASIC METABOLIC PANEL
Anion gap: 11 (ref 5–15)
BUN: 11 mg/dL (ref 6–20)
CO2: 22 mmol/L (ref 22–32)
Calcium: 8.9 mg/dL (ref 8.9–10.3)
Chloride: 104 mmol/L (ref 98–111)
Creatinine, Ser: 0.68 mg/dL (ref 0.44–1.00)
GFR, Estimated: >60 mL/min (ref >60)
Glucose, Bld: 85 mg/dL (ref 70–99)
Potassium: 3.9 mmol/L (ref 3.5–5.1)
Sodium: 137 mmol/L (ref 135–145)

## 2022-09-02 LAB — CK: Total CK: 64 U/L (ref 38–234)

## 2022-09-02 MED ORDER — OXYCODONE-ACETAMINOPHEN 5-325 MG PO TABS
1.0000 | ORAL_TABLET | Freq: Once | ORAL | Status: AC
  Administered 2022-09-02: 1 via ORAL
  Filled 2022-09-02: qty 1

## 2022-09-02 MED ORDER — OXYCODONE HCL 5 MG PO TABS: 5.0000 mg | ORAL_TABLET | Freq: Four times a day (QID) | ORAL | 0 refills | Status: DC | PRN

## 2022-09-02 MED ORDER — PREDNISONE 10 MG (21) PO TBPK: ORAL_TABLET | Freq: Every day | ORAL | 0 refills | Status: DC

## 2022-09-02 MED ORDER — KETOROLAC TROMETHAMINE 30 MG/ML IJ SOLN
15.0000 mg | Freq: Once | INTRAMUSCULAR | Status: AC
  Administered 2022-09-02: 15 mg via INTRAVENOUS
  Filled 2022-09-02: qty 1

## 2022-09-02 NOTE — Discharge Instructions (Addendum)
We evaluated you for your left-sided neck and shoulder pain.  Your symptoms are most likely caused by cervical radiculopathy, a pinched nerve in your neck.  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  Please take Tylenol and Motrin for your symptoms at home.  You can take 1000 mg of Tylenol every 6 hours and 600 mg of ibuprofen every 6 hours as needed for your symptoms.  You can take these medicines together as needed, either at the same time, or alternating every 3 hours.  I prescribed you some additional oxycodone to take for your symptoms.  Please take 1 tablet every 6 hours as needed for pain unrelieved by Tylenol and Motrin.  Do not mix this with alcohol or drive while taking this medication.  Please call neurosurgery for follow-up.  Please follow-up with your primary doctor about the shoulder arthritis findings.  I talked to [facility] with neurosurgery and she will try to see you in clinic this week.  Please return to the emergency department if you develop any new symptoms such as weakness, trouble with grip strength, numbness or tingling in other parts of your body or weakness in other parts of your body, worsening or severe pain, color change in your arm, or any other concerning symptoms.

## 2022-09-02 NOTE — ED Provider Notes (Signed)
[place] EMERGENCY DEPARTMENT AT [facility] Provider Note  CSN: [id number] Arrival date & time: 09/02/22 0944  Chief Complaint(s) Neck Pain  HPI Joanne Hughes is a 47 y.o. female without significant past medical history presenting to the emergency department with neck pain.  She reports neck pain for the past month.  Reports it radiates into the left shoulder.  She reports some painful range of motion left shoulder.  She reports it is a stabbing pain.  She reports that due to the pain she sometimes has trouble moving her left arm.  Denies any real numbness or tingling.  Reports her arm feels weak due to pain.  No fevers or chills.  No trauma.  Has tried physical therapy, without significant improvement.  Reports that the only thing that really helped was prednisone and Valium.  She reports the pain is worsening and now shooting down her left arm.   Past Medical History Past Medical History:  Diagnosis Date   Elevated LDL cholesterol level 10/30/2018   Hidradenitis    Hirsutism    tried spironolactone and vaniqa in past without success   History of elevated lipids    History of prediabetes    A1c 5.9% January 2017   Hypothyroid    IUD contraception    MRSA (methicillin resistant Staphylococcus aureus)    per medical record from [place]   PCOS (polycystic ovarian syndrome)    Sickle cell anemia    Vitamin D deficiency 11/03/2018   Patient Active Problem List   Diagnosis Date Noted   Prediabetes 11/25/2021   Body mass index (BMI) of 34.0 to 34.9 in adult 07/28/2021   Decreased libido 01/20/2020   Vitamin D deficiency 11/03/2018   Elevated LDL cholesterol level 10/30/2018   Acute pain of left knee 09/12/2016   Postoperative hypothyroidism 07/12/2016   History of PCOS 07/12/2016   IUD contraception 07/12/2016   Hirsutism    Hidradenitis    MRSA (methicillin resistant Staphylococcus aureus)    Home Medication(s) Prior to Admission medications   Medication Sig  Start Date End Date Taking? Authorizing Provider  predniSONE (STERAPRED UNI-PAK 21 TAB) 10 MG (21) TBPK tablet Take by mouth daily. Take 6 tabs by mouth daily  for 2 days, then 5 tabs for 2 days, then 4 tabs for 2 days, then 3 tabs for 2 days, 2 tabs for 2 days, then 1 tab by mouth daily for 2 days 09/02/22  Yes Ervey Fallin, Jerilee Field, MD  CALCIUM CITRATE PO Take 3 tablets by mouth daily.    [provider]  diazepam (VALIUM) 5 MG tablet Take 1 tablet (5 mg total) by mouth every 12 (twelve) hours as needed for muscle spasms. 08/16/22   Mardella Layman, MD  ketoconazole (NIZORAL) 2 % shampoo Apply 1 application topically 2 (two) times a week. 08/01/18   Henson, Vickie L, NP-C  levonorgestrel (MIRENA) 20 MCG/24HR IUD 1 each by Intrauterine route once.    [provider]  levothyroxine (SYNTHROID) 137 MCG tablet Take 1 tablet (137 mcg total) by mouth daily before breakfast. 08/03/22   Joselyn Arrow, MD  meloxicam (MOBIC) 15 MG tablet Take 1 tablet (15 mg total) by mouth daily with food for up to 2 weeks, then take as needed 07/26/22   Joselyn Arrow, MD  Multiple Vitamins-Minerals (CENTRUM SILVER 50+WOMEN PO) Take by mouth.    [provider]  oxyCODONE (ROXICODONE) 5 MG immediate release tablet Take 1 tablet (5 mg total) by mouth every 6 (six)  hours as needed for severe pain. 09/02/22   Lonell Grandchild, MD                                                                                                                                    Past Surgical History Past Surgical History:  Procedure Laterality Date   BREAST REDUCTION SURGERY  05/1997   CESAREAN SECTION     CHOLECYSTECTOMY     gastric sleeve surgery  08/2015   surgeon Dr. Tilda Burrow of [place]   REDUCTION MAMMAPLASTY Bilateral    THYROIDECTOMY  06/04/2013   surgeon Dr. Ernest Pine of [place]   Family History Family History  Problem Relation Age of Onset   Hypertension Mother    Hypothyroidism Mother    Hypertension Father     Stroke Father    Diabetes Sister    Leukemia Sister    Breast cancer Cousin    Colon cancer Neg Hx    Colon polyps Neg Hx    Esophageal cancer Neg Hx    Rectal cancer Neg Hx    Stomach cancer Neg Hx     Social History Social History   Tobacco Use   Smoking status: Never   Smokeless tobacco: Never  Vaping Use   Vaping Use: Never used  Substance Use Topics   Alcohol use: Yes    Alcohol/week: 2.0 standard drinks of alcohol    Types: 2 Glasses of wine per week   Drug use: No   Allergies Patient has no known allergies.  Review of Systems Review of Systems  All other systems reviewed and are negative.   Physical Exam Vital Signs  I have reviewed the triage vital signs BP (!) 134/92   Pulse 76   Temp 98.2 F (36.8 C) (Oral)   Resp 13   SpO2 100%  Physical Exam Vitals and nursing note reviewed.  Constitutional:      General: She is not in acute distress.    Appearance: She is well-developed.  HENT:     Head: Normocephalic and atraumatic.     Mouth/Throat:     Mouth: Mucous membranes are moist.  Eyes:     Pupils: Pupils are equal, round, and reactive to light.  Cardiovascular:     Rate and Rhythm: Normal rate and regular rhythm.     Heart sounds: No murmur heard. Pulmonary:     Effort: Pulmonary effort is normal. No respiratory distress.     Breath sounds: Normal breath sounds.  Abdominal:     General: Abdomen is flat.     Palpations: Abdomen is soft.     Tenderness: There is no abdominal tenderness.  Musculoskeletal:        General: No tenderness.     Right lower leg: No edema.     Left lower leg: No edema.     Comments: Able to range the left shoulder passively without significant pain, does  have pain with active range of motion.  No deformity.  No upper extremity edema.  2+ radial pulses bilaterally.  No sensory or neurologic deficit in the upper extremities.  Skin:    General: Skin is warm and dry.  Neurological:     General: No focal deficit  present.     Mental Status: She is alert. Mental status is at baseline.  Psychiatric:        Mood and Affect: Mood normal.        Behavior: Behavior normal.     ED Results and Treatments Labs (all labs ordered are listed, but only abnormal results are displayed) Labs Reviewed  CBC WITH DIFFERENTIAL/PLATELET - Abnormal; Notable for the following components:      Result Value   Hemoglobin 11.3 (*)    HCT 35.8 (*)    All other components within normal limits  BASIC METABOLIC PANEL  CK                                                                                                                          Radiology MR Cervical Spine Wo Contrast  Result Date: 09/02/2022 CLINICAL DATA:  Myelopathy, acute, cervical spine EXAM: MRI CERVICAL SPINE WITHOUT CONTRAST TECHNIQUE: Multiplanar, multisequence MR imaging of the cervical spine was performed. No intravenous contrast was administered. COMPARISON:  None Available. FINDINGS: Alignment: Smooth reversal of the cervical lordosis. No static listhesis. Vertebrae: No fracture, evidence of discitis, or bone lesion. Cord: No definite cervical cord signal abnormality. There is cord compression at the C4-5 and C5-6 levels. Posterior Fossa, vertebral arteries, paraspinal tissues: Negative. Disc levels: C2-C3: Unremarkable. C3-C4: Mild disc bulge resulting in impress upon the ventral thecal sac and mild canal stenosis. No foraminal stenosis. C4-C5: Disc osteophyte complex, eccentric to the left with left-sided uncovertebral spurring. Severe canal stenosis with compression of the left hemicord. Moderate left foraminal stenosis. C5-C6: Disc osteophyte complex, slightly eccentric to the left resulting in severe canal stenosis and cord compression. Mild-to-moderate bilateral foraminal stenosis. C6-C7: Unremarkable. C7-T1: Unremarkable. IMPRESSION: 1. 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 No definite cervical cord signal  abnormality. 2. Moderate left foraminal stenosis at C4-5 and mild-to-moderate bilateral foraminal stenosis at C5-6. 3. Mild canal stenosis at C3-4. Electronically Signed   By: Duanne Guess D.O.   On: 09/02/2022 14:25   DG Shoulder Left  Result Date: 09/02/2022 CLINICAL DATA:  Chronic left shoulder pain.  No injury. EXAM: LEFT SHOULDER - 2+ VIEW COMPARISON:  None Available. FINDINGS: No acute fracture or dislocation. Mild degenerative changes of the acromioclavicular joint. The glenohumeral joint space is preserved. Bone mineralization is normal. Soft tissues are unremarkable. IMPRESSION: 1. Mild acromioclavicular osteoarthritis. Electronically Signed   By: Obie Dredge M.D.   On: 09/02/2022 11:28    Pertinent labs & imaging results that were available during my care of the patient were reviewed by me and considered in my medical decision making (see MDM for details).  Medications Ordered in ED Medications  ketorolac (TORADOL) 30 MG/ML injection 15 mg (15 mg Intravenous Given 09/02/22 1105)  oxyCODONE-acetaminophen (PERCOCET/ROXICET) 5-325 MG per tablet 1 tablet (1 tablet Oral Given 09/02/22 1328)                                                                                                                                     Procedures Procedures  (including critical care time)  Medical Decision Making / ED Course   MDM:  47 year old female presenting to the emergency department with neck and shoulder pain.  Patient is well-appearing, but tearful and in pain.  Differential includes radiculopathy, rotator cuff pathology, muscle spasm.  Doubt infection, no fevers or chills, able to range the shoulder.  Doubt spinal cord process given reassuring neurologic exam but could be nerve impingement and given persistent and worsening symptoms we will check MRI.  Will check x-ray of the left shoulder.  Doubt upper extremity DVT with no edema, erythema.  Will treat symptoms.  Will  reassess.  Clinical Course as of 09/02/22 1534  Sat Sep 02, 2022  1531 MRI shows left-sided cervical facet narrowing as well as spinal cord stenosis with compression without cord signal abnormality.  X-ray also shows some left AC joint osteoarthritis.  Discussed with neurosurgery PA Kandice Moos, given that patient has a reassuring neurologic exam she recommends steroid treatment and follow-up with them in clinic.  She will try to see this patient this week.  Discussed with patient she should also follow-up with her primary doctor for shoulder arthritis finding as this may also be contributing to her pain. [place] Controlled Substance Reporting System database was reviewed. and patient was instructed, not to drive, operate heavy machinery, perform activities at heights, swimming or participation in water activities or provide baby-sitting services while on Pain, Sleep and Anxiety Medications; until their outpatient Physician has advised to do so again. Also recommended to not to take more than prescribed Pain, Sleep and Anxiety Medications. Will discharge patient to home. All questions answered. Patient comfortable with plan of discharge. Return precautions discussed with patient and specified on the after visit summary.  [WS]    Clinical Course User Index [WS] Lonell Grandchild, MD     Additional history obtained: -External records from outside source obtained and reviewed including: Chart review including previous notes, labs, imaging, consultation notes including previous PMD notes   Lab Tests: -I ordered, reviewed, and interpreted labs.   The pertinent results include:   Labs Reviewed  CBC WITH DIFFERENTIAL/PLATELET - Abnormal; Notable for the following components:      Result Value   Hemoglobin 11.3 (*)    HCT 35.8 (*)    All other components within normal limits  BASIC METABOLIC PANEL  CK    Notable for normal CK, borderline anemia  Imaging Studies ordered: I ordered  imaging studies including MRI cervical spine, XR shoulder On my interpretation  imaging demonstrates AC osteoarthritis, ?facet osteoarthritis  I independently visualized and interpreted imaging. I agree with the radiologist interpretation   Medicines ordered and prescription drug management: Meds ordered this encounter  Medications   ketorolac (TORADOL) 30 MG/ML injection 15 mg   oxyCODONE-acetaminophen (PERCOCET/ROXICET) 5-325 MG per tablet 1 tablet   DISCONTD: oxyCODONE (ROXICODONE) 5 MG immediate release tablet    Sig: Take 1 tablet (5 mg total) by mouth every 6 (six) hours as needed for severe pain.    Dispense:  30 tablet    Refill:  0   predniSONE (STERAPRED UNI-PAK 21 TAB) 10 MG (21) TBPK tablet    Sig: Take by mouth daily. Take 6 tabs by mouth daily  for 2 days, then 5 tabs for 2 days, then 4 tabs for 2 days, then 3 tabs for 2 days, 2 tabs for 2 days, then 1 tab by mouth daily for 2 days    Dispense:  42 tablet    Refill:  0   oxyCODONE (ROXICODONE) 5 MG immediate release tablet    Sig: Take 1 tablet (5 mg total) by mouth every 6 (six) hours as needed for severe pain.    Dispense:  10 tablet    Refill:  0    -I have reviewed the patients home medicines and have made adjustments as needed   Consultations Obtained: I requested consultation with the neurosurgery team,  and discussed lab and imaging findings as well as pertinent plan - they recommend: discharge with close follow up with them    Cardiac Monitoring: The patient was maintained on a cardiac monitor.  I personally viewed and interpreted the cardiac monitored which showed an underlying rhythm of: NSR  Reevaluation: After the interventions noted above, I reevaluated the patient and found that their symptoms have improved  Co morbidities that complicate the patient evaluation  Past Medical History:  Diagnosis Date   Elevated LDL cholesterol level 10/30/2018   Hidradenitis    Hirsutism    tried spironolactone  and vaniqa in past without success   History of elevated lipids    History of prediabetes    A1c 5.9% January 2017   Hypothyroid    IUD contraception    MRSA (methicillin resistant Staphylococcus aureus)    per medical record from [place]   PCOS (polycystic ovarian syndrome)    Sickle cell anemia    Vitamin D deficiency 11/03/2018      Dispostion: Disposition decision including need for hospitalization was considered, and patient discharged from emergency department.    Final Clinical Impression(s) / ED Diagnoses Final diagnoses:  Cervical radiculopathy  Arthritis of left shoulder region     This chart was dictated using voice recognition software.  Despite best efforts to proofread,  errors can occur which can change the documentation meaning.    Lonell Grandchild, MD 09/02/22 1534

## 2022-09-02 NOTE — ED Triage Notes (Addendum)
Pt arrives for continued neck pain radiating down into L shoulder and arm. Has been to PCP and UC and treated with dry needling, meloxicam, valium, and steroids at different points over the last month. Temporary relief with medications but now that she's out of these is having more pain. Tearful in triage.

## 2022-09-06 NOTE — Therapy (Addendum)
OUTPATIENT PHYSICAL THERAPY TREATMENT NOTE/DC SUMMARY   Patient Name: Joanne Hughes MRN: [id number] DOB:1975/06/30, 47 y.o., female Today's Date: 09/08/2022  PCP: Joselyn Arrow, MD   REFERRING PROVIDER: Joselyn Arrow, MD PHYSICAL THERAPY DISCHARGE SUMMARY  Visits from Start of Care: 3  Current functional level related to goals / functional outcomes: UTA   Remaining deficits: UTA   Education / Equipment: HEP   Patient agrees to discharge. Patient goals were partially met. Patient is being discharged due to not returning since the last visit.   PT End of Session - 09/08/22 1348     Visit Number 3    Number of Visits 9    Date for PT Re-Evaluation 10/11/22    Authorization Type [place] [employer]    PT Start Time 1350    PT Stop Time 1430    PT Time Calculation (min) 40 min    Activity Tolerance Patient tolerated treatment well    Behavior During Therapy [facility] for tasks assessed/performed              Past Medical History:  Diagnosis Date   Elevated LDL cholesterol level 10/30/2018   Hidradenitis    Hirsutism    tried spironolactone and vaniqa in past without success   History of elevated lipids    History of prediabetes    A1c 5.9% January 2017   Hypothyroid    IUD contraception    MRSA (methicillin resistant Staphylococcus aureus)    per medical record from [place]   PCOS (polycystic ovarian syndrome)    Sickle cell anemia    Vitamin D deficiency 11/03/2018   Past Surgical History:  Procedure Laterality Date   BREAST REDUCTION SURGERY  05/1997   CESAREAN SECTION     CHOLECYSTECTOMY     gastric sleeve surgery  08/2015   surgeon Dr. Tilda Burrow of [place]   REDUCTION MAMMAPLASTY Bilateral    THYROIDECTOMY  06/04/2013   surgeon Dr. Ernest Pine of [place]   Patient Active Problem List   Diagnosis Date Noted   Prediabetes 11/25/2021   Body mass index (BMI) of 34.0 to 34.9 in adult 07/28/2021   Decreased libido 01/20/2020   Vitamin D deficiency 11/03/2018    Elevated LDL cholesterol level 10/30/2018   Acute pain of left knee 09/12/2016   Postoperative hypothyroidism 07/12/2016   History of PCOS 07/12/2016   IUD contraception 07/12/2016   Hirsutism    Hidradenitis    MRSA (methicillin resistant Staphylococcus aureus)     THERAPY DIAG:  Cervicalgia  Muscle weakness (generalized)   Rationale for Evaluation and Treatment Rehabilitation  REFERRING DIAG: M54.2 (ICD-10-CM) - Neck pain M62.838 (ICD-10-CM) - Muscle spasm  PERTINENT HISTORY: Sickle cell   PRECAUTIONS/RESTRICTIONS:   None   SUBJECTIVE: Remains on dose pack, pain level 6/10.  L neck feels stiff.   PAIN:  Are you having pain?  Yes: NPRS scale: 8/10 Worst: 10/10 Pain location: left upper trap Pain description: sharp, tight Aggravating factors: work duties, turning Relieving factors: ice  OBJECTIVE: (objective measures completed at initial evaluation unless otherwise dated)  DIAGNOSTIC FINDINGS:  N/A   PATIENT SURVEYS:  FOTO: 41% function; 63% predicted   COGNITION: Overall cognitive status: Within functional limits for tasks assessed   SENSATION: WFL   POSTURE: rounded shoulders and forward head   PALPATION: TTP to L upper trap     CERVICAL ROM:    Active ROM A/PROM (deg) eval 09/08/22   Flexion   75%  Extension   10%  Right lateral flexion   75%  Left lateral flexion   75%  Right rotation 60 75%  Left rotation 45 p! 75%   (Blank rows = not tested)   UPPER EXTREMITY MMT:   MMT Right eval Left eval  Shoulder flexion      Shoulder extension      Shoulder abduction      Shoulder adduction      Shoulder extension      Shoulder internal rotation      Shoulder external rotation      Middle trapezius 3/5 3/5  Lower trapezius 3/5 3/5  Elbow flexion      Elbow extension      Wrist flexion      Wrist extension      Wrist ulnar deviation      Wrist radial deviation      Wrist pronation      Wrist supination      Grip strength        (Blank rows = not tested)   CERVICAL SPECIAL TESTS:  Neck flexor muscle endurance test: 5 sec   [facility] Adult PT Treatment:                                                DATE: 09/08/22 Therapeutic Exercise: Nustep L2 8 min Supine alternating OH flexion 1# 15/15 Supine chin tucks over 1/2 roll 2x10 Open book 10/10 Supine hor abduction YTB 15x B, 15/15 unilaterally L levator stretch 30s x2 Manual Therapy: Re-assess CROM and assess for active Tp's finding only point tenderness to L levator scapula.  [facility] Adult PT Treatment:                                                DATE: 08/22/2022 Therapeutic Exercise: Foam roller routine for thoracic mobility - including protraction/retraction (unilateral and bilateral), cc/cw circles, horizontal abduction, shoulder flexion/ext alternating, shoulder abduction, thread the needle, and thoracic ext Bil shoulder abd - GTB - 2x10 Bil shoulder diagonals  - 2x10 - GTB Manual Therapy: Positional release to L upper trap STM L UT, LS, cervical paraspinals   [facility] Adult PT Treatment:                                                DATE: 08/16/2022 Therapeutic Exercise: Seated scap retraction x 5 Seated upper trap stretch x 30" L Seated bilateral ER RTB x 5 Supine chin tuck x 5 - 3" hold Manual Therapy: Skilled palpation of trigger points for TPDN Positional release to L upper trap Trigger Point Dry Needling Treatment: Pre-treatment instruction: Patient instructed on dry needling rationale, procedures, and possible side effects including pain during treatment (achy,cramping feeling), bruising, drop of blood, lightheadedness, nausea, sweating. Patient Consent Given: Yes Education handout provided: No Muscles treated: left upper trap  Needle size and number: .30x75mm x 2 Electrical stimulation performed: No Parameters: N/A Treatment response/outcome: Twitch response elicited and Palpable decrease in muscle tension Post-treatment instructions: Patient  instructed to expect possible mild to moderate muscle soreness later today and/or tomorrow. Patient instructed in methods to reduce muscle  soreness and to continue prescribed HEP. If patient was dry needled over the lung field, patient was instructed on signs and symptoms of pneumothorax and, however unlikely, to see immediate medical attention should they occur. Patient was also educated on signs and symptoms of infection and to seek medical attention should they occur. Patient verbalized understanding of these instructions and education.      HOME EXERCISE PROGRAM: Access Code: [email or web address] URL: [email or web address] Date: 08/16/2022 Prepared by: Edwinna Areola   Exercises - Seated Scapular Retraction  - 1 x daily - 7 x weekly - 2 sets - 10 reps - 3 sec hold - Seated Upper Trapezius Stretch (Mirrored)  - 1 x daily - 7 x weekly - 2 reps - 20-30 sec hold - Shoulder External Rotation and Scapular Retraction with Resistance  - 1 x daily - 7 x weekly - 3 sets - 10 reps - red band hold - Supine Chin Tuck  - 1 x daily - 7 x weekly - 2 sets - 10 reps - 3 sec hold  ASSESSMENT:   CLINICAL IMPRESSION: Continues with minimal symptoms.  CROM increased as noted.  Added postural correction exercises as well as stretching activities to promote mobility and stretch.  TTP at L levator but declined TPDN due to minimal symptoms.   OBJECTIVE IMPAIRMENTS: decreased mobility, difficulty walking, decreased ROM, decreased strength, postural dysfunction, and pain.    ACTIVITY LIMITATIONS: carrying, lifting, sitting, and reach over head   PARTICIPATION LIMITATIONS: driving, shopping, community activity, occupation, and yard work   PERSONAL FACTORS: 1 comorbidity: Sickle cell  are also affecting patient's functional outcome.    REHAB POTENTIAL: Excellent   CLINICAL DECISION MAKING: Stable/uncomplicated   EVALUATION COMPLEXITY: Low     GOALS: Goals reviewed with patient? No   SHORT TERM GOALS:  Target date: 09/06/2022   Pt will be compliant and knowledgeable with initial HEP for improved comfort and carryover Baseline: initial HEP given  Goal status: INITIAL   2.  Pt will self report neck pain no greater than 6/10 for improved comfort and functional ability Baseline: 10/10 at worst Goal status: INITIAL    LONG TERM GOALS: Target date: 10/11/2022   Pt will improve FOTO function score to no less than 63% as proxy for functional improvement Baseline: 41% function Goal status: INITIAL    2.  Pt will self report neck pain no greater than 3/10 for improved comfort and functional ability Baseline: 10/10 at worst Goal status: INITIAL    3.  Pt will improve L cervical rotation to no less than 60 for improved comfort and functional ability with work and driving Baseline: 45 Goal status: INITIAL   4.  Pt will be able to perform work related duties not limited by pain for improved comfort and function Baseline: unable Goal status: INITIAL   PLAN:   PT FREQUENCY: 1x/week   PT DURATION: 8 weeks   PLANNED INTERVENTIONS: Therapeutic exercises, Therapeutic activity, Neuromuscular re-education, Balance training, Gait training, Patient/Family education, Self Care, Joint mobilization, Dry Needling, Electrical stimulation, Cryotherapy, Moist heat, Vasopneumatic device, Manual therapy, and Re-evaluation   PLAN FOR NEXT SESSION: assess HEP response, periscapular and DNF strengthening, TPDN and manual   Hildred Laser PT 09/08/2022, 2:43 PM

## 2022-09-08 ENCOUNTER — Other Ambulatory Visit (HOSPITAL_COMMUNITY): Payer: Self-pay

## 2022-09-08 ENCOUNTER — Ambulatory Visit: Payer: 59

## 2022-09-08 DIAGNOSIS — M6281 Muscle weakness (generalized): Secondary | ICD-10-CM

## 2022-09-08 DIAGNOSIS — M542 Cervicalgia: Secondary | ICD-10-CM

## 2022-09-12 ENCOUNTER — Other Ambulatory Visit (HOSPITAL_COMMUNITY): Payer: Self-pay

## 2022-09-12 DIAGNOSIS — M5412 Radiculopathy, cervical region: Secondary | ICD-10-CM | POA: Diagnosis not present

## 2022-09-12 DIAGNOSIS — M5020 Other cervical disc displacement, unspecified cervical region: Secondary | ICD-10-CM | POA: Diagnosis not present

## 2022-09-12 MED ORDER — DIAZEPAM 5 MG PO TABS
5.0000 mg | ORAL_TABLET | Freq: Two times a day (BID) | ORAL | 0 refills | Status: DC | PRN
  Filled 2022-09-12: qty 20, 10d supply, fill #0

## 2022-09-14 ENCOUNTER — Ambulatory Visit
Admission: RE | Admit: 2022-09-14 | Discharge: 2022-09-14 | Disposition: A | Discharge status: Home / Self Care | Payer: 59 | Source: Ambulatory Visit

## 2022-09-14 DIAGNOSIS — Z1231 Encounter for screening mammogram for malignant neoplasm of breast: Secondary | ICD-10-CM

## 2022-09-15 ENCOUNTER — Ambulatory Visit: Payer: 59

## 2022-09-22 ENCOUNTER — Ambulatory Visit: Payer: 59

## 2022-10-12 ENCOUNTER — Other Ambulatory Visit (HOSPITAL_COMMUNITY): Payer: Self-pay

## 2022-10-12 DIAGNOSIS — M50121 Cervical disc disorder at C4-C5 level with radiculopathy: Secondary | ICD-10-CM | POA: Diagnosis not present

## 2022-10-12 DIAGNOSIS — M5020 Other cervical disc displacement, unspecified cervical region: Secondary | ICD-10-CM | POA: Diagnosis not present

## 2022-10-12 DIAGNOSIS — M4722 Other spondylosis with radiculopathy, cervical region: Secondary | ICD-10-CM | POA: Diagnosis not present

## 2022-10-12 DIAGNOSIS — M50122 Cervical disc disorder at C5-C6 level with radiculopathy: Secondary | ICD-10-CM | POA: Diagnosis not present

## 2022-10-12 HISTORY — PX: OTHER SURGICAL HISTORY: SHX169

## 2022-10-12 MED ORDER — CYCLOBENZAPRINE HCL 10 MG PO TABS
10.0000 mg | ORAL_TABLET | Freq: Three times a day (TID) | ORAL | 0 refills | Status: DC
  Filled 2022-10-12: qty 30, 10d supply, fill #0

## 2022-10-12 MED ORDER — OXYCODONE-ACETAMINOPHEN 5-325 MG PO TABS
1.0000 | ORAL_TABLET | ORAL | 0 refills | Status: DC | PRN
  Filled 2022-10-12: qty 30, 5d supply, fill #0

## 2022-10-31 ENCOUNTER — Other Ambulatory Visit (HOSPITAL_COMMUNITY): Payer: Self-pay

## 2022-10-31 ENCOUNTER — Other Ambulatory Visit: Payer: Self-pay | Admitting: Family Medicine

## 2022-10-31 DIAGNOSIS — E890 Postprocedural hypothyroidism: Secondary | ICD-10-CM

## 2022-10-31 MED ORDER — OXYCODONE-ACETAMINOPHEN 5-325 MG PO TABS
1.0000 | ORAL_TABLET | ORAL | 0 refills | Status: DC | PRN
  Filled 2022-10-31: qty 30, 5d supply, fill #0

## 2022-10-31 MED ORDER — LEVOTHYROXINE SODIUM 137 MCG PO TABS
137.0000 ug | ORAL_TABLET | Freq: Every day | ORAL | 0 refills | Status: DC
Start: 2022-10-31 — End: 2023-03-05
  Filled 2022-10-31: qty 90, 90d supply, fill #0

## 2022-10-31 MED ORDER — CYCLOBENZAPRINE HCL 10 MG PO TABS
10.0000 mg | ORAL_TABLET | Freq: Three times a day (TID) | ORAL | 1 refills | Status: DC | PRN
  Filled 2022-10-31: qty 30, 10d supply, fill #0

## 2022-11-01 ENCOUNTER — Other Ambulatory Visit (HOSPITAL_COMMUNITY): Payer: Self-pay

## 2022-11-07 ENCOUNTER — Encounter: Payer: Self-pay | Admitting: *Deleted

## 2022-11-17 ENCOUNTER — Encounter: Payer: Self-pay | Admitting: Family Medicine

## 2022-11-26 NOTE — Progress Notes (Unsigned)
No chief complaint on file.  Joanne Hughes is a 47 y.o. female who presents for a complete physical.   She has the following concerns:  She has been struggling with weight loss, and sent a message reporting interest in GLP-1 medications for treatment.   Immunization History  Administered Date(s) Administered   Influenza,inj,Quad PF,6+ Mos 03/01/2017, 01/20/2021   Influenza-Unspecified 03/01/2016, 03/01/2017, 02/08/2018, 02/03/2022   PFIZER(Purple Top)SARS-COV-2 Vaccination 05/31/2019, 06/20/2019, 06/04/2020   Td 05/23/2007   Tdap 03/01/2016   Last Pap smear: 05/2021 NILM, no high risk HPV (done by GYN) Last mammogram: 08/2022 Last colonoscopy: 01/2022 Dr. Orvan Falconer, normal Last DEXA: none Dentist: Ophtho: Dr. Dione Booze, 04/2022 (?borderline glaucoma? UPDATE***) Exercise:   PMH, PSH, SH and FH were reviewed and updated  ROS:  The patient denies anorexia, fever, weight changes, headaches,  vision changes, decreased hearing, ear pain, sore throat, breast concerns, chest pain, palpitations, dizziness, syncope, dyspnea on exertion, cough, swelling, nausea, vomiting, diarrhea, constipation, abdominal pain, melena, hematochezia, indigestion/heartburn, hematuria, incontinence, dysuria, irregular menstrual cycles, vaginal discharge, odor or itch, genital lesions, joint pains, numbness, tingling, weakness, tremor, suspicious skin lesions, depression, anxiety, abnormal bleeding/bruising, or enlarged lymph nodes.  UPDATE ALL Menses?   PHYSICAL EXAM:  There were no vitals taken for this visit.  Wt Readings from Last 3 Encounters:  07/26/22 210 lb (95.3 kg)  01/20/22 207 lb (93.9 kg)  12/30/21 207 lb (93.9 kg)   General Appearance:    Alert, cooperative, no distress, appears stated age  Head:    Normocephalic, without obvious abnormality, atraumatic  Eyes:    PERRL, conjunctiva/corneas clear, EOM's intact, fundi benign  Ears:   Normal TM's and external ear canals  Nose:  No drainage or sinus  tenderness  Throat:  Normal mucosa, no lesions  Neck:   Supple, no lymphadenopathy;  thyroid:  surgically absent, not palpable; no carotid bruit or JVD  Back:    Spine nontender, no curvature, no CVA tenderness  Lungs:     Clear to auscultation bilaterally without wheezes, rales or ronchi; respirations unlabored  Chest Wall:    No tenderness or deformity   Heart:    Regular rate and rhythm, S1 and S2 normal, no murmur, rub or gallop  Breast Exam:   Per OB/GYN  Abdomen:     Soft, non-tender, nondistended, normoactive bowel sounds, no masses, no hepatosplenomegaly  Genitalia:   Per OB/GYN       Extremities:   No clubbing, cyanosis or edema  Pulses:   2+ and symmetric all extremities  Skin:   Skin color, texture, turgor normal, no rashes or lesions  Lymph nodes:   Cervical, supraclavicular, and axillary nodes normal  Neurologic:   CNII-XII intact, normal strength, sensation and gait; reflexes 2+ and symmetric throughout                 Psych:   Normal mood, affect, hygiene and grooming.   ***UPDATE--cervical scars (thyroid, from recent neck surgery   ASSESSMENT/PLAN:   GLP-1 vs referral to MWM??  Discussed monthly self breast exams and yearly mammograms; at least 30 minutes of aerobic activity at least 5 days/week, weight-bearing exercise at least 2x/week; proper sunscreen use reviewed; healthy diet, including goals of calcium and vitamin D intake and alcohol recommendations (less than or equal to 1 drink/day) reviewed; regular seatbelt use; changing batteries in smoke detectors.  Immunization recommendations discussed--continue yearly flu shots. Updated COVID booster when available in the Fall.   Colonoscopy recommendations reviewed, up to date (10  year f/u recommended)

## 2022-11-26 NOTE — Patient Instructions (Incomplete)
  HEALTH MAINTENANCE RECOMMENDATIONS:  It is recommended that you get at least 30 minutes of aerobic exercise at least 5 days/week (for weight loss, you may need as much as 60-90 minutes). This can be any activity that gets your heart rate up. This can be divided in 10-15 minute intervals if needed, but try and build up your endurance at least once a week.  Weight bearing exercise is also recommended twice weekly.  Eat a healthy diet with lots of vegetables, fruits and fiber.  "Colorful" foods have a lot of vitamins (ie green vegetables, tomatoes, red peppers, etc).  Limit sweet tea, regular sodas and alcoholic beverages, all of which has a lot of calories and sugar.  Up to 1 alcoholic drink daily may be beneficial for women (unless trying to lose weight, watch sugars).  Drink a lot of water.  Calcium recommendations are 1200-1500 mg daily (1500 mg for postmenopausal women or women without ovaries), and vitamin D 1000 IU daily.  This should be obtained from diet and/or supplements (vitamins), and calcium should not be taken all at once, but in divided doses.  Monthly self breast exams and yearly mammograms for women over the age of 23 is recommended.  Sunscreen of at least SPF 30 should be used on all sun-exposed parts of the skin when outside between the hours of 10 am and 4 pm (not just when at beach or pool, but even with exercise, golf, tennis, and yard work!)  Use a sunscreen that says "broad spectrum" so it covers both UVA and UVB rays, and make sure to reapply every 1-2 hours.  Remember to change the batteries in your smoke detectors when changing your clock times in the spring and fall. Carbon monoxide detectors are recommended for your home.  Use your seat belt every time you are in a car, and please drive safely and not be distracted with cell phones and texting while driving.  Work on cutting back (and ideally cut out entirely) sweet tea and other sugary beverages. Work on [employer] in [employer]. Work on getting more whole grains. Limit your portions of simple carbs.  Avoid packaged snacks like Doritos.  Consider using a tracking program like MyFitnessPal or Lose It! It helps with accountability and portion control.  If you are struggling with your weight, look into what is available for employees through [employer], and also consider the [facility] Weight [facility] clinic.  Discuss your libido issues with your GYN (to evaluate to see if testosterone would be helpful vs other treatments).

## 2022-11-27 ENCOUNTER — Encounter: Payer: Self-pay | Admitting: Family Medicine

## 2022-11-27 ENCOUNTER — Ambulatory Visit (INDEPENDENT_AMBULATORY_CARE_PROVIDER_SITE_OTHER): Payer: 59 | Admitting: Family Medicine

## 2022-11-27 VITALS — BP 110/60 | HR 60 | Ht 66.0 in | Wt 207.6 lb

## 2022-11-27 DIAGNOSIS — E7800 Pure hypercholesterolemia, unspecified: Secondary | ICD-10-CM | POA: Diagnosis not present

## 2022-11-27 DIAGNOSIS — E890 Postprocedural hypothyroidism: Secondary | ICD-10-CM

## 2022-11-27 DIAGNOSIS — Z0000 Encounter for general adult medical examination without abnormal findings: Secondary | ICD-10-CM

## 2022-11-27 DIAGNOSIS — E6609 Other obesity due to excess calories: Secondary | ICD-10-CM

## 2022-11-27 DIAGNOSIS — R7303 Prediabetes: Secondary | ICD-10-CM

## 2022-11-27 DIAGNOSIS — Z7689 Persons encountering health services in other specified circumstances: Secondary | ICD-10-CM | POA: Diagnosis not present

## 2022-11-27 DIAGNOSIS — Z6833 Body mass index (BMI) 33.0-33.9, adult: Secondary | ICD-10-CM | POA: Diagnosis not present

## 2022-11-27 LAB — CBC WITH DIFFERENTIAL/PLATELET
EOS (ABSOLUTE): 0.1 10*3/uL (ref 0.0–0.4)
Lymphocytes Absolute: 1.7 10*3/uL (ref 0.7–3.1)
MCHC: 31.8 g/dL (ref 31.5–35.7)
Monocytes Absolute: 0.4 10*3/uL (ref 0.1–0.9)
Neutrophils Absolute: 2.3 10*3/uL (ref 1.4–7.0)
Platelets: 328 10*3/uL (ref 150–450)
RDW: 13.8 % (ref 11.7–15.4)
WBC: 4.4 10*3/uL (ref 3.4–10.8)

## 2022-11-27 LAB — CMP14+EGFR

## 2022-11-27 LAB — POCT URINALYSIS DIP (PROADVANTAGE DEVICE)
Bilirubin, UA: NEGATIVE
Blood, UA: NEGATIVE
Glucose, UA: NEGATIVE mg/dL
Ketones, POC UA: NEGATIVE mg/dL
Leukocytes, UA: NEGATIVE
Nitrite, UA: NEGATIVE
Protein Ur, POC: NEGATIVE mg/dL
Specific Gravity, Urine: 1.01
Urobilinogen, Ur: 0.2
pH, UA: 6 (ref 5.0–8.0)

## 2022-11-27 LAB — LIPID PANEL

## 2022-11-27 LAB — HEMOGLOBIN A1C: Hgb A1c MFr Bld: 5.8 % — ABNORMAL HIGH (ref 4.8–5.6)

## 2022-11-28 ENCOUNTER — Encounter: Payer: Self-pay | Admitting: Family Medicine

## 2022-11-28 LAB — LIPID PANEL
Chol/HDL Ratio: 2.5 ratio (ref 0.0–4.4)
HDL: 84 mg/dL (ref >39)
LDL Chol Calc (NIH): 119 mg/dL — ABNORMAL HIGH (ref 0–99)
Triglycerides: 51 mg/dL (ref 0–149)

## 2022-11-28 LAB — CMP14+EGFR
AST: 25 IU/L (ref 0–40)
Bilirubin Total: 0.7 mg/dL (ref 0.0–1.2)
CO2: 23 mmol/L (ref 20–29)
Globulin, Total: 2.8 g/dL (ref 1.5–4.5)
Glucose: 86 mg/dL (ref 70–99)
Potassium: 4.2 mmol/L (ref 3.5–5.2)
Sodium: 138 mmol/L (ref 134–144)
eGFR: 108 mL/min/{1.73_m2} (ref >59)

## 2022-11-28 LAB — CBC WITH DIFFERENTIAL/PLATELET
Basophils Absolute: 0 10*3/uL (ref 0.0–0.2)
Basos: 0 %
Eos: 1 %
Hematocrit: 35.8 % (ref 34.0–46.6)
Hemoglobin: 11.4 g/dL (ref 11.1–15.9)
Immature Grans (Abs): 0 10*3/uL (ref 0.0–0.1)
Immature Granulocytes: 0 %
Lymphs: 38 %
MCH: 28.3 pg (ref 26.6–33.0)
MCV: 89 fL (ref 79–97)
Monocytes: 8 %
Neutrophils: 53 %
RBC: 4.03 x10E6/uL (ref 3.77–5.28)

## 2022-11-28 LAB — TSH: TSH: 0.168 u[IU]/mL — ABNORMAL LOW (ref 0.450–4.500)

## 2022-11-28 LAB — HEMOGLOBIN A1C: Est. average glucose Bld gHb Est-mCnc: 120 mg/dL

## 2022-11-28 NOTE — Telephone Encounter (Signed)
Done

## 2022-11-29 LAB — T4, FREE: Free T4: 1.53 ng/dL (ref 0.82–1.77)

## 2022-11-29 LAB — SPECIMEN STATUS REPORT

## 2022-11-29 LAB — T3: T3, Total: 102 ng/dL (ref 71–180)

## 2022-12-04 ENCOUNTER — Other Ambulatory Visit (HOSPITAL_COMMUNITY): Payer: Self-pay

## 2022-12-04 MED ORDER — OXYCODONE-ACETAMINOPHEN 5-325 MG PO TABS
1.0000 | ORAL_TABLET | ORAL | 0 refills | Status: DC | PRN
  Filled 2022-12-04: qty 30, 5d supply, fill #0

## 2023-01-26 ENCOUNTER — Telehealth: Payer: 59 | Admitting: Physician Assistant

## 2023-01-26 DIAGNOSIS — L259 Unspecified contact dermatitis, unspecified cause: Secondary | ICD-10-CM | POA: Diagnosis not present

## 2023-01-26 MED ORDER — PREDNISONE 10 MG (21) PO TBPK
ORAL_TABLET | ORAL | 0 refills | Status: DC
Start: 2023-01-26 — End: 2023-11-29

## 2023-01-26 NOTE — Progress Notes (Signed)
E Visit for Rash  We are sorry that you are not feeling well. Here is how we plan to help!  Based on what you shared with me it looks like you have contact dermatitis.  Contact dermatitis is a skin rash caused by something that touches the skin and causes irritation or inflammation.  Your skin may be red, swollen, dry, cracked, and itch.  The rash should go away in a few days but can last a few weeks.  If you get a rash, it's important to figure out what caused it so the irritant can be avoided in the future.  I have prescribed: Prednisone 10 mg daily for 6 days (see taper instructions below)  Directions for 6 day taper: Day 1: 2 tablets before breakfast, 1 after both lunch & dinner and 2 at bedtime Day 2: 1 tab before breakfast, 1 after both lunch & dinner and 2 at bedtime Day 3: 1 tab at each meal & 1 at bedtime Day 4: 1 tab at breakfast, 1 at lunch, 1 at bedtime Day 5: 1 tab at breakfast & 1 tab at bedtime Day 6: 1 tab at breakfast   HOME CARE:  Take cool showers and avoid direct sunlight. Apply cool compress or wet dressings. Take a bath in an oatmeal bath.  Sprinkle content of one Aveeno packet under running faucet with comfortably warm water.  Bathe for 15-20 minutes, 1-2 times daily.  Pat dry with a towel. Do not rub the rash. Use hydrocortisone cream. Take an antihistamine like Benadryl for widespread rashes that itch.  The adult dose of Benadryl is 25-50 mg by mouth 4 times daily. Caution:  This type of medication may cause sleepiness.  Do not drink alcohol, drive, or operate dangerous machinery while taking antihistamines.  Do not take these medications if you have prostate enlargement.  Read package instructions thoroughly on all medications that you take.  GET HELP RIGHT AWAY IF:  Symptoms don't go away after treatment. Severe itching that persists. If you rash spreads or swells. If you rash begins to smell. If it blisters and opens or develops a yellow-brown crust. You  develop a fever. You have a sore throat. You become short of breath.  MAKE SURE YOU:  Understand these instructions. Will watch your condition. Will get help right away if you are not doing well or get worse.  Thank you for choosing an e-visit.  Your e-visit answers were reviewed by a board certified advanced clinical practitioner to complete your personal care plan. Depending upon the condition, your plan could have included both over the counter or prescription medications.  Please review your pharmacy choice. Make sure the pharmacy is open so you can pick up prescription now. If there is a problem, you may contact your provider through [employer] and have the prescription routed to another pharmacy.  Your safety is important to [place]. If you have drug allergies check your prescription carefully.   For the next 24 hours you can use MyChart to ask questions about today's visit, request a non-urgent call back, or ask for a work or school excuse. You will get an email in the next two days asking about your experience. I hope that your e-visit has been valuable and will speed your recovery.  I have spent 5 minutes in review of e-visit questionnaire, review and updating patient chart, medical decision making and response to patient.   Margaretann Loveless, PA-C

## 2023-01-30 DIAGNOSIS — M5020 Other cervical disc displacement, unspecified cervical region: Secondary | ICD-10-CM | POA: Diagnosis not present

## 2023-01-30 DIAGNOSIS — M5412 Radiculopathy, cervical region: Secondary | ICD-10-CM | POA: Diagnosis not present

## 2023-03-05 ENCOUNTER — Other Ambulatory Visit (HOSPITAL_COMMUNITY): Payer: Self-pay

## 2023-03-05 ENCOUNTER — Other Ambulatory Visit: Payer: Self-pay | Admitting: Family Medicine

## 2023-03-05 DIAGNOSIS — E890 Postprocedural hypothyroidism: Secondary | ICD-10-CM

## 2023-03-05 MED ORDER — LEVOTHYROXINE SODIUM 137 MCG PO TABS
137.0000 ug | ORAL_TABLET | Freq: Every day | ORAL | 1 refills | Status: DC
Start: 2023-03-05 — End: 2023-10-30
  Filled 2023-03-05: qty 90, 90d supply, fill #0
  Filled 2023-07-03: qty 90, 90d supply, fill #1

## 2023-03-09 ENCOUNTER — Other Ambulatory Visit (HOSPITAL_COMMUNITY): Payer: Self-pay

## 2023-03-16 ENCOUNTER — Encounter (HOSPITAL_COMMUNITY): Payer: Self-pay

## 2023-03-16 ENCOUNTER — Other Ambulatory Visit (HOSPITAL_COMMUNITY): Payer: Self-pay

## 2023-03-16 DIAGNOSIS — L258 Unspecified contact dermatitis due to other agents: Secondary | ICD-10-CM | POA: Diagnosis not present

## 2023-03-16 MED ORDER — FLUTICASONE PROPIONATE 0.05 % EX CREA
1.0000 | TOPICAL_CREAM | Freq: Two times a day (BID) | CUTANEOUS | 3 refills | Status: AC | PRN
  Filled 2023-03-16: qty 60, 30d supply, fill #0

## 2023-03-19 ENCOUNTER — Other Ambulatory Visit (HOSPITAL_COMMUNITY): Payer: Self-pay

## 2023-05-01 ENCOUNTER — Other Ambulatory Visit (HOSPITAL_COMMUNITY): Payer: Self-pay

## 2023-05-01 ENCOUNTER — Telehealth: Payer: 59 | Admitting: Physician Assistant

## 2023-05-01 DIAGNOSIS — H00012 Hordeolum externum right lower eyelid: Secondary | ICD-10-CM | POA: Diagnosis not present

## 2023-05-01 MED ORDER — POLYMYXIN B-TRIMETHOPRIM 10000-0.1 UNIT/ML-% OP SOLN
1.0000 [drp] | Freq: Four times a day (QID) | OPHTHALMIC | 0 refills | Status: DC
Start: 2023-05-01 — End: 2023-11-29
  Filled 2023-05-01: qty 10, 12d supply, fill #0

## 2023-05-01 NOTE — Progress Notes (Signed)
I have spent 5 minutes in review of e-visit questionnaire, review and updating patient chart, medical decision making and response to patient.   Mia Milan Cody Jacklynn Dehaas, PA-C    

## 2023-05-01 NOTE — Progress Notes (Signed)
  E-Visit for Stye   We are sorry that you are not feeling well. Here is how we plan to help!  Based on what you have shared with me it looks like you have a stye.  A stye is an inflammation of the eyelid.  It is often a red, painful lump near the edge of the eyelid that may look like a boil or a pimple.  A stye develops when an infection occurs at the base of an eyelash.   We have made appropriate suggestions for you based upon your presentation: Simple styes can be treated without medical intervention.  Most styes either resolve spontaneously or resolve with simple home treatment by applying warm compresses or heated washcloth to the stye for about 10-15 minutes three to four times a day. This causes the stye to drain and resolve.  Giving increased tenderness and swelling, I have added on a prescription antibiotic drop to use as directed.  HOME CARE:  Wash your hands often! Let the stye open on its own. Don't squeeze or open it. Don't rub your eyes. This can irritate your eyes and let in bacteria.  If you need to touch your eyes, wash your hands first. Don't wear eye makeup or contact lenses until the area has healed.  GET HELP RIGHT AWAY IF:  Your symptoms do not improve. You develop blurred or loss of vision. Your symptoms worsen (increased discharge, pain or redness).   Thank you for choosing an e-visit.  Your e-visit answers were reviewed by a board certified advanced clinical practitioner to complete your personal care plan. Depending upon the condition, your plan could have included both over the counter or prescription medications.  Please review your pharmacy choice. Make sure the pharmacy is open so you can pick up prescription now. If there is a problem, you may contact your provider through [employer] and have the prescription routed to another pharmacy.  Your safety is important to [place]. If you have drug allergies check your prescription carefully.   For the next 24  hours you can use MyChart to ask questions about today's visit, request a non-urgent call back, or ask for a work or school excuse. You will get an email in the next two days asking about your experience. I hope that your e-visit has been valuable and will speed your recovery.

## 2023-05-01 NOTE — Progress Notes (Signed)
Message sent to patient requesting further input regarding current symptoms. Awaiting patient response.  

## 2023-05-22 ENCOUNTER — Other Ambulatory Visit (HOSPITAL_COMMUNITY): Payer: Self-pay

## 2023-05-22 MED ORDER — DIAZEPAM 5 MG PO TABS
5.0000 mg | ORAL_TABLET | Freq: Two times a day (BID) | ORAL | 0 refills | Status: DC | PRN
  Filled 2023-05-22: qty 20, 10d supply, fill #0

## 2023-05-25 DIAGNOSIS — M5412 Radiculopathy, cervical region: Secondary | ICD-10-CM | POA: Diagnosis not present

## 2023-07-04 ENCOUNTER — Other Ambulatory Visit (HOSPITAL_COMMUNITY): Payer: Self-pay

## 2023-07-25 ENCOUNTER — Ambulatory Visit: Payer: 59 | Admitting: Family Medicine

## 2023-08-01 ENCOUNTER — Other Ambulatory Visit (HOSPITAL_COMMUNITY): Payer: Self-pay

## 2023-08-02 ENCOUNTER — Encounter (HOSPITAL_COMMUNITY): Payer: Self-pay

## 2023-08-02 ENCOUNTER — Other Ambulatory Visit (HOSPITAL_COMMUNITY): Payer: Self-pay

## 2023-08-31 IMAGING — MG MM DIGITAL SCREENING BILAT W/ TOMO AND CAD
8 series · 8 of 24 positions shown · non-contrast
Comparison: Previous exam(s).

CLINICAL DATA: Screening.

EXAM:
DIGITAL SCREENING BILATERAL MAMMOGRAM WITH TOMOSYNTHESIS AND CAD
TECHNIQUE: Bilateral screening digital craniocaudal and mediolateral oblique
mammograms were obtained. Bilateral screening digital breast
tomosynthesis was performed. The images were evaluated with
computer-aided detection.

[L CC synth-2D]
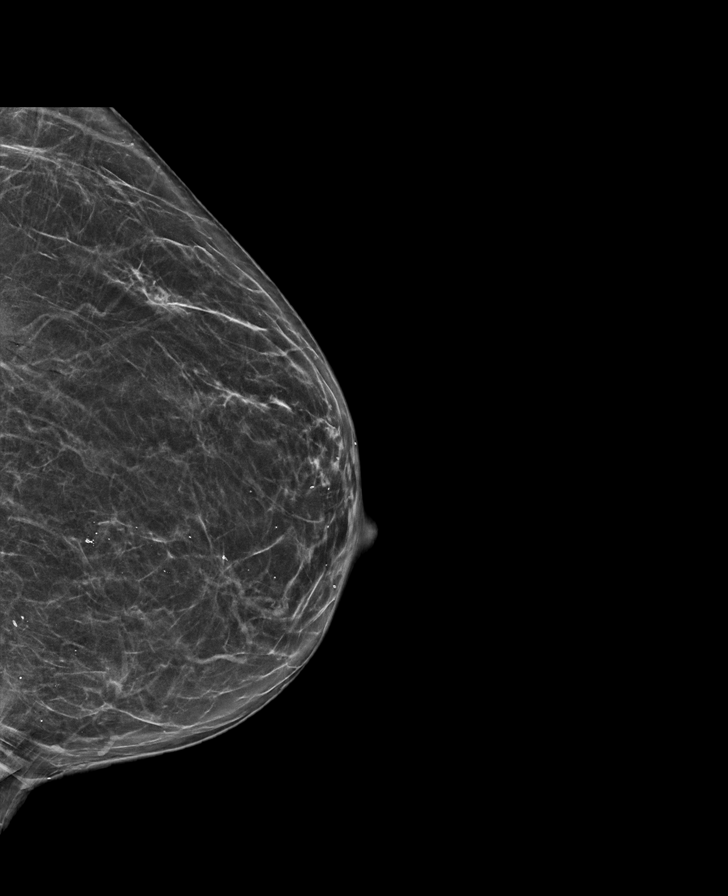

[L MLO synth-2D]
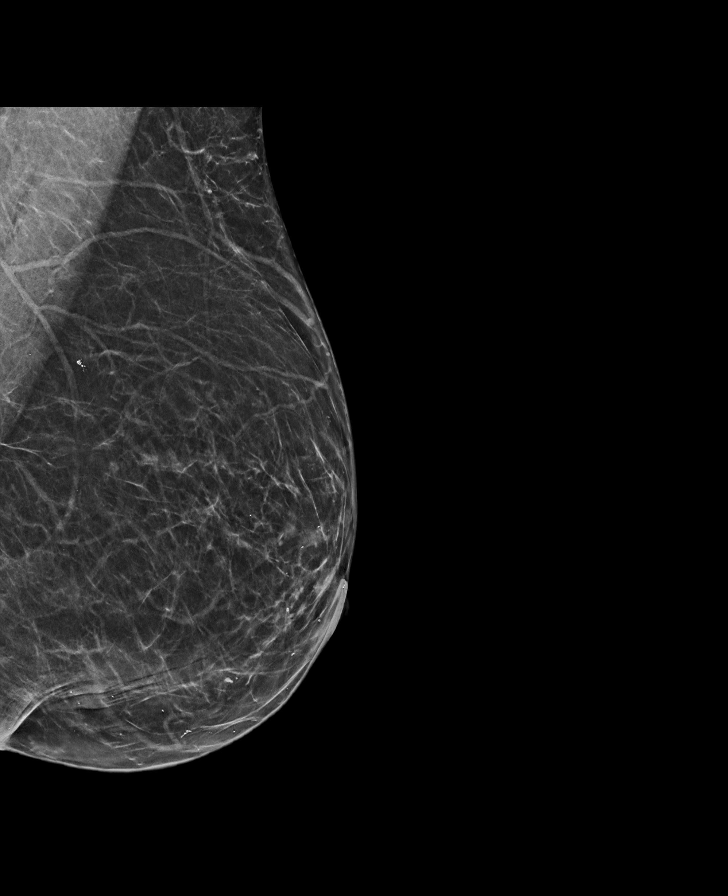

[R MLO synth-2D]
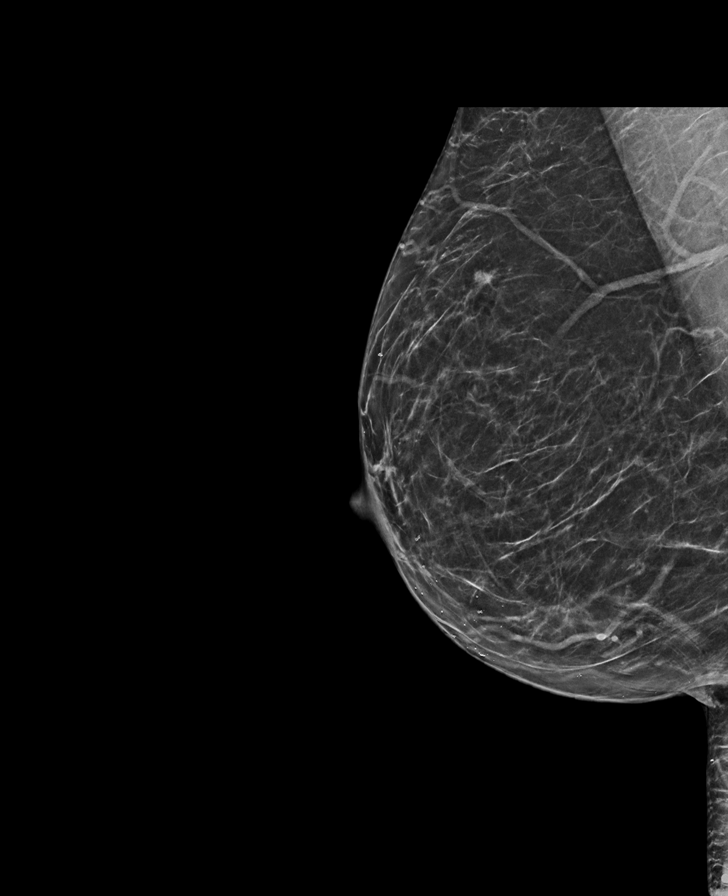

[R CC synth-2D]
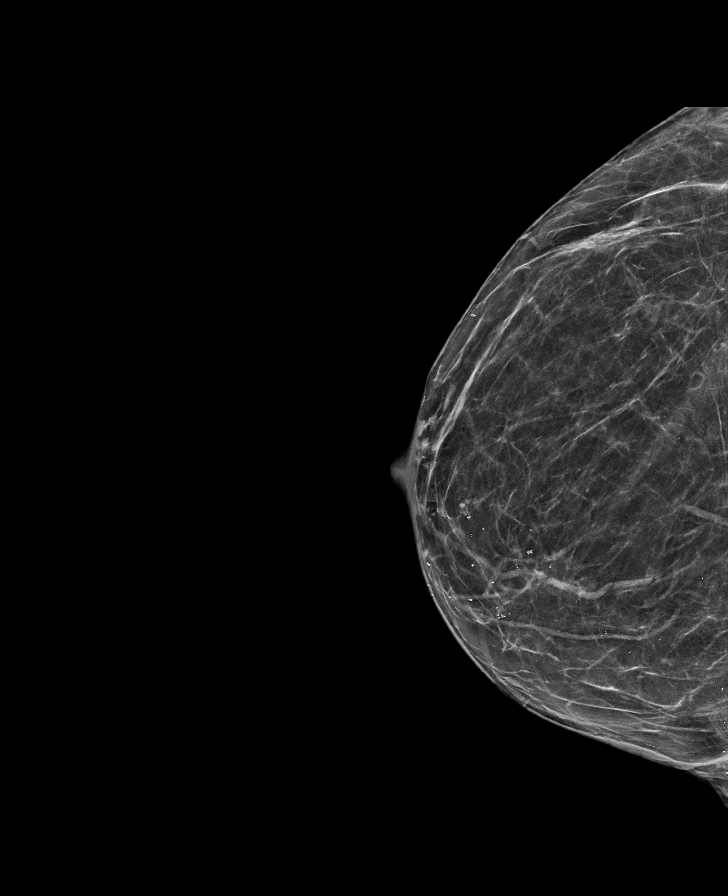

[R MLO tomo · tomo slice 34/67.0]
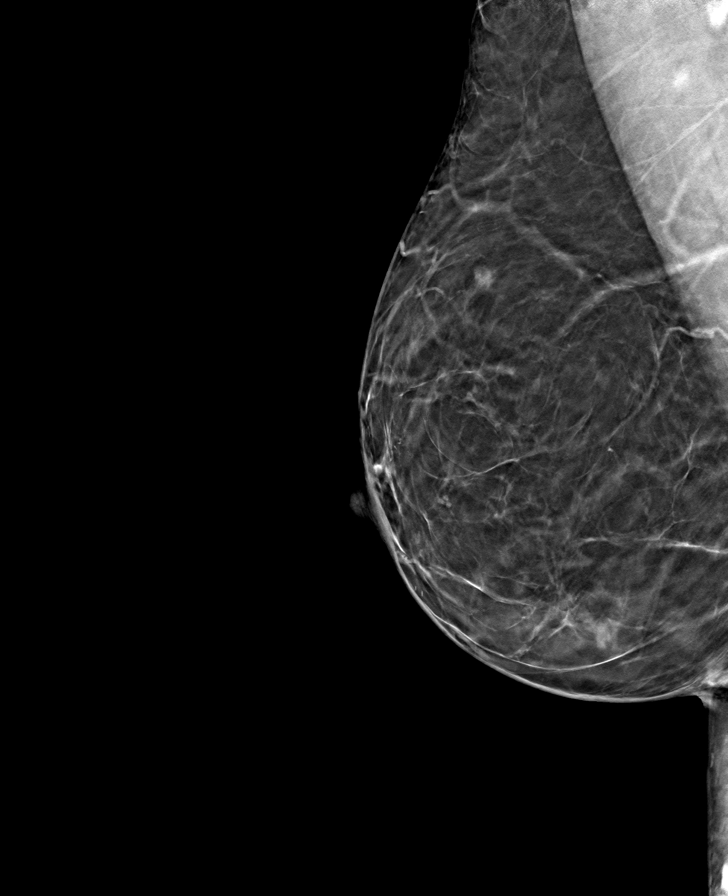

[R CC tomo · tomo slice 33/65.0]
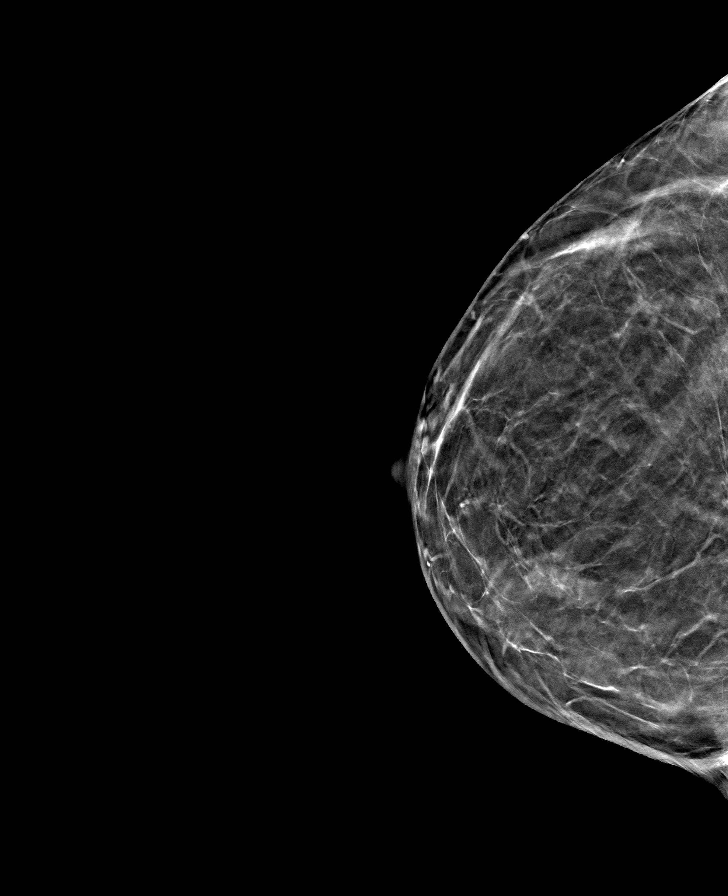

[L MLO tomo · tomo slice 33/66.0]
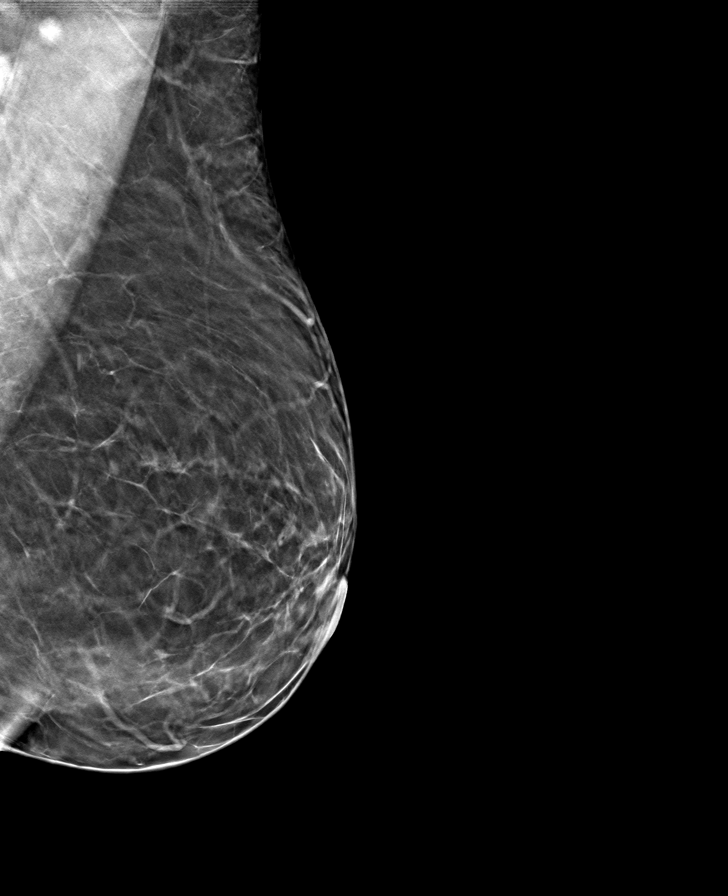

[L CC tomo · tomo slice 33/64.0]
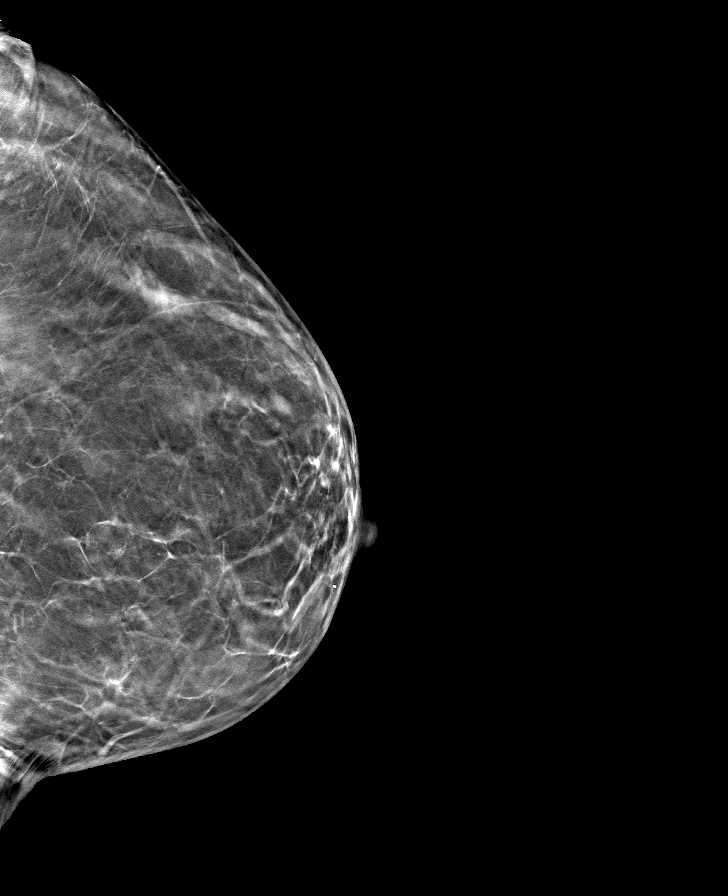

[8 of 24 positions shown; findings below may reference images not displayed]

ACR Breast Density Category b: There are scattered areas of
fibroglandular density.
FINDINGS: In the right breast, a possible asymmetry warrants further
evaluation. In the left breast, no findings suspicious for
malignancy.
IMPRESSION: Further evaluation is suggested for possible asymmetry in the right
breast.

RECOMMENDATION:
Diagnostic mammogram and possibly ultrasound of the right breast.
(Code:BU-C-IIK)

The patient will be contacted regarding the findings, and additional
imaging will be scheduled.

BI-RADS CATEGORY  0: Incomplete. Need additional imaging evaluation
and/or prior mammograms for comparison.

## 2023-09-02 IMAGING — MG MM DIGITAL DIAGNOSTIC UNILAT*R* W/ TOMO W/ CAD
4 series · 4 of 12 positions shown · non-contrast
Comparison: Previous exam(s).

CLINICAL DATA: Recall from screening mammography, possible
asymmetry in the upper RIGHT breast at middle to posterior depth
visible only on the MLO view. Personal history of BILATERAL
reduction mammoplasty in 9888.

EXAM:
DIGITAL DIAGNOSTIC UNILATERAL RIGHT MAMMOGRAM WITH TOMOSYNTHESIS AND
CAD
TECHNIQUE: Right digital diagnostic mammography and breast tomosynthesis was
performed. The images were evaluated with computer-aided detection.

[R ML synth-2D]
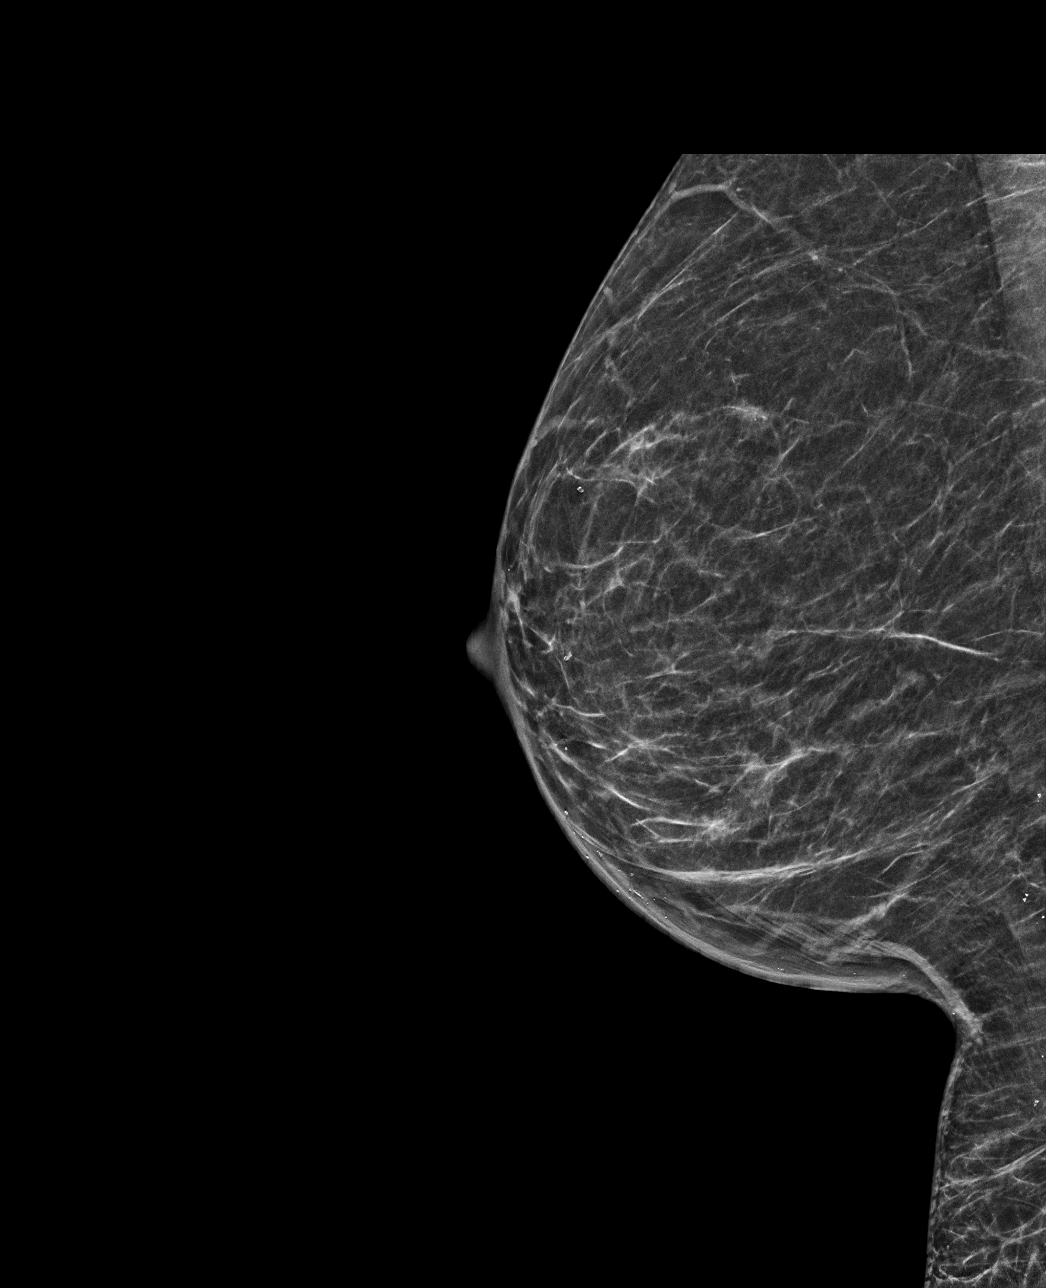

[R MLO synth-2D]
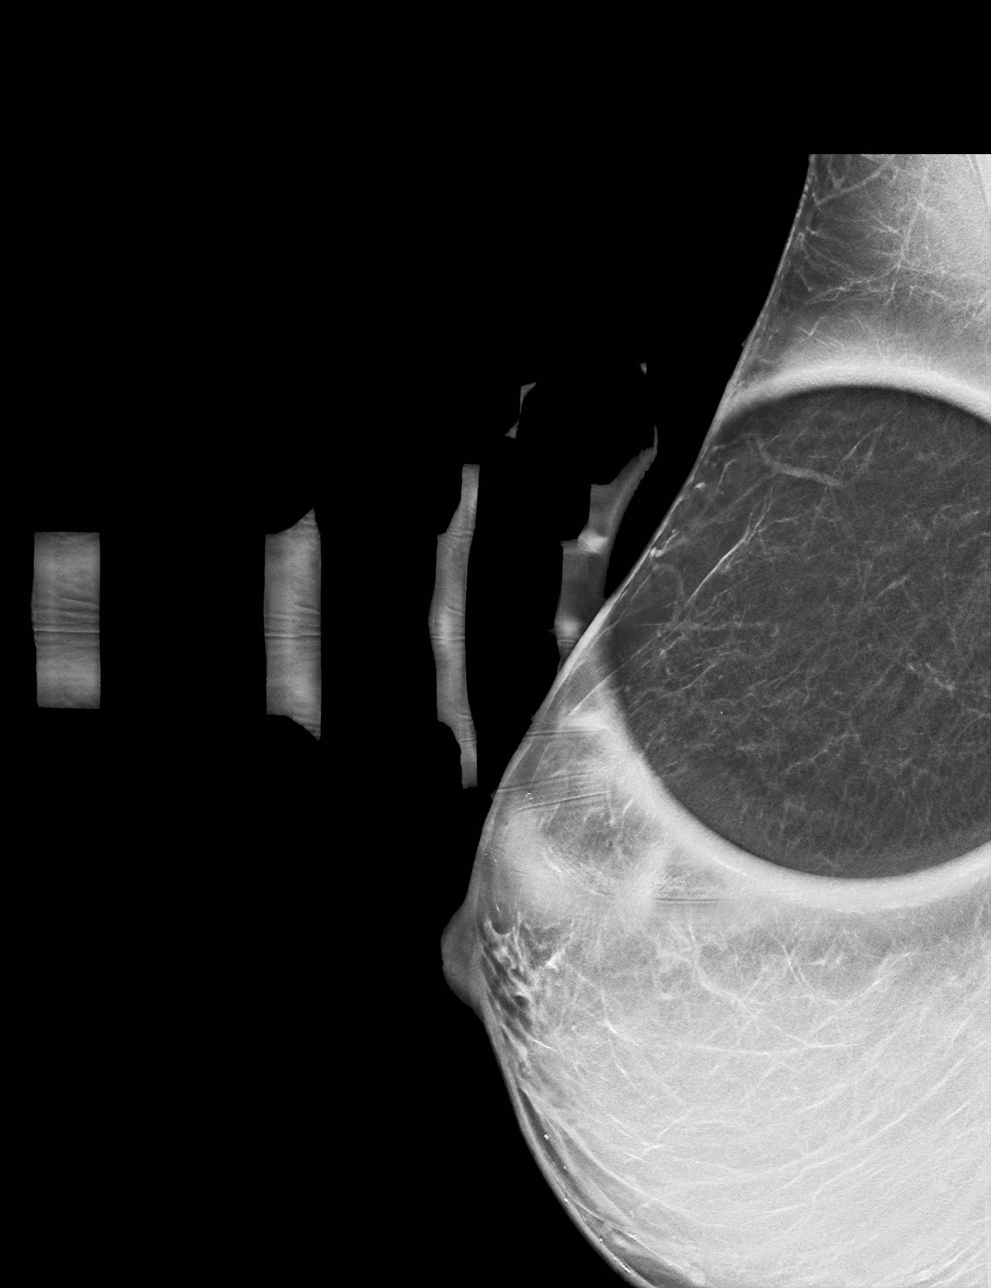

[R ML tomo · tomo slice 27/53.0]
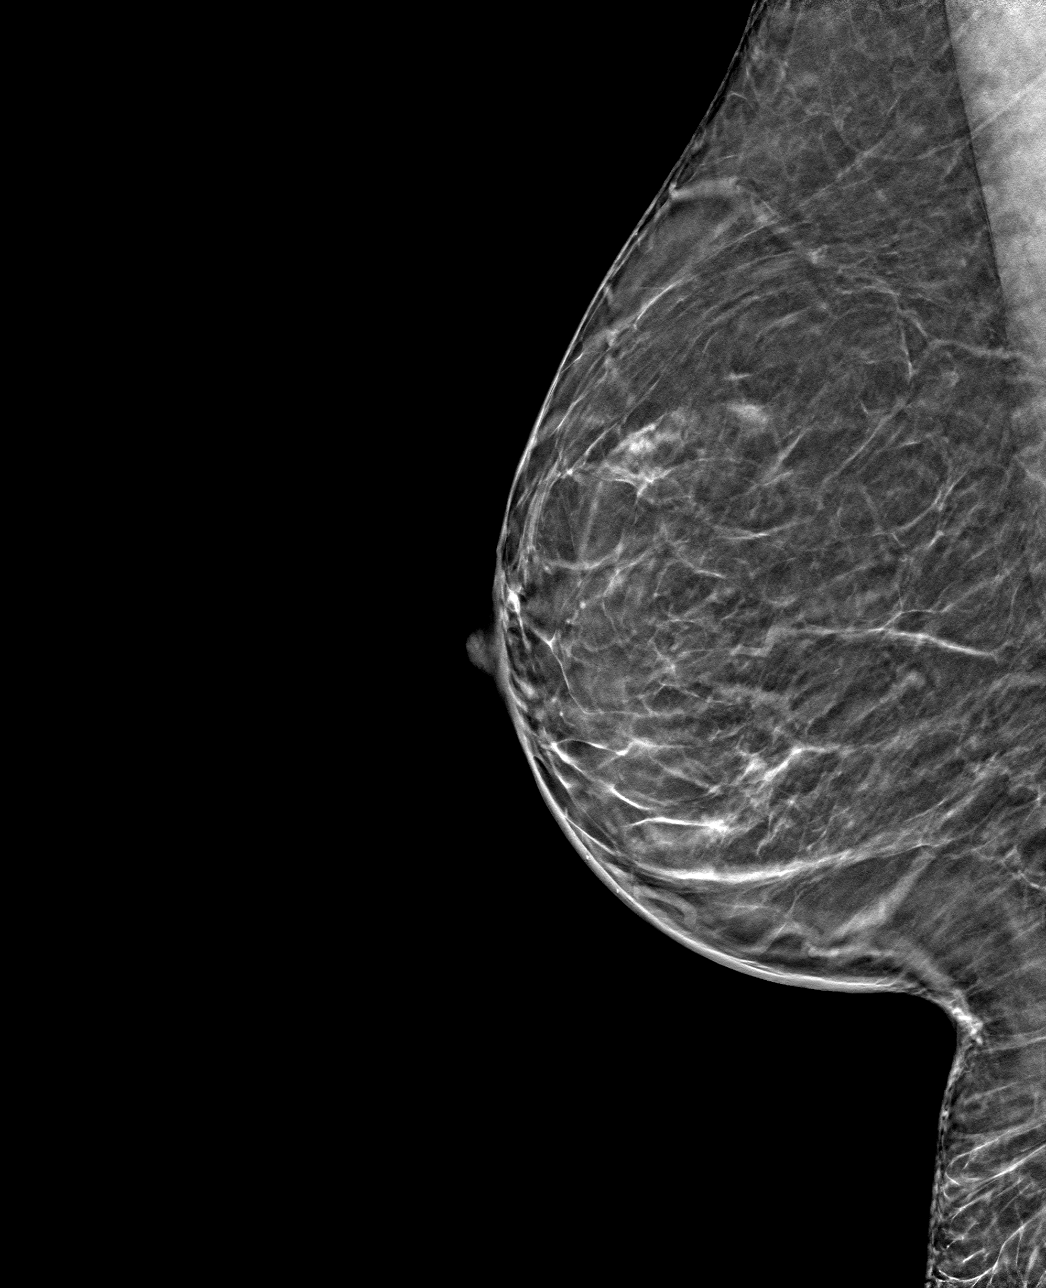

[R MLO tomo · tomo slice 21/42.0]
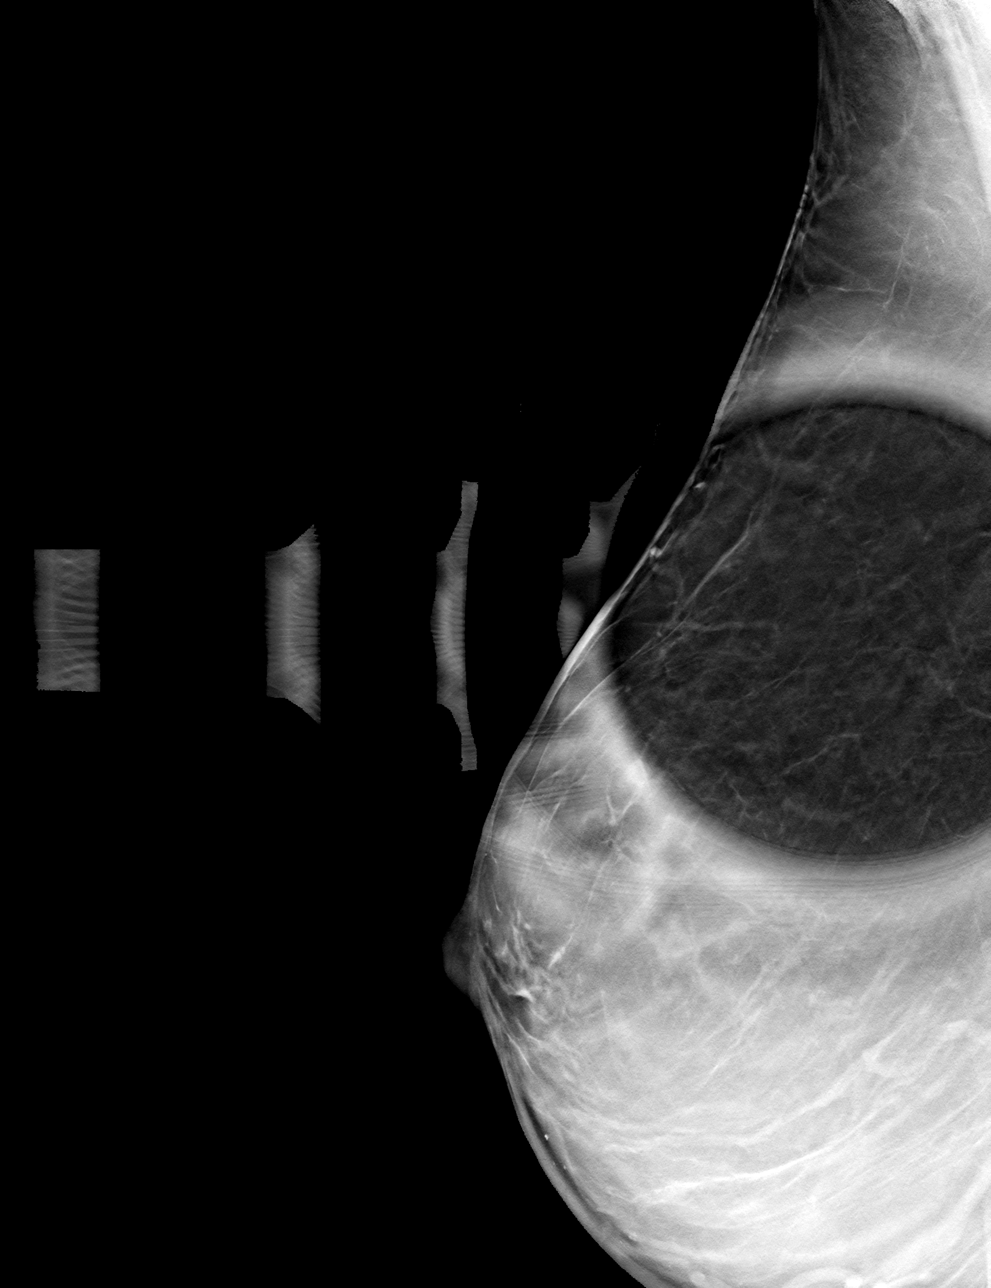

[4 of 12 positions shown; findings below may reference images not displayed]

ACR Breast Density Category b: There are scattered areas of
fibroglandular density.
FINDINGS: Spot-compression MLO view of the area of concern and a full field
mediolateral view were obtained.

The asymmetry questioned on screening mammography in the upper
breast completely disperses with compression, indicating overlapping
fibroglandular tissue. There is no underlying mass or architectural
distortion.

No findings suspicious for malignancy.
IMPRESSION: No mammographic evidence of malignancy involving the RIGHT breast.

RECOMMENDATION:
Screening mammogram in one year.(Code:V8-3-V3M)

I have discussed the findings and recommendations with the patient.
If applicable, a reminder letter will be sent to the patient
regarding the next appointment.

BI-RADS CATEGORY  1: Negative.

## 2023-10-29 ENCOUNTER — Other Ambulatory Visit (HOSPITAL_COMMUNITY): Payer: Self-pay

## 2023-10-29 ENCOUNTER — Ambulatory Visit (INDEPENDENT_AMBULATORY_CARE_PROVIDER_SITE_OTHER): Admitting: Obstetrics and Gynecology

## 2023-10-29 ENCOUNTER — Encounter: Payer: Self-pay | Admitting: Obstetrics and Gynecology

## 2023-10-29 ENCOUNTER — Other Ambulatory Visit (HOSPITAL_COMMUNITY)
Admission: RE | Admit: 2023-10-29 | Discharge: 2023-10-29 | Disposition: A | Discharge status: Home / Self Care | Source: Ambulatory Visit | Attending: Obstetrics and Gynecology | Admitting: Obstetrics and Gynecology

## 2023-10-29 VITALS — BP 134/87 | HR 85 | Ht 66.0 in | Wt 189.0 lb

## 2023-10-29 DIAGNOSIS — Z01419 Encounter for gynecological examination (general) (routine) without abnormal findings: Secondary | ICD-10-CM | POA: Insufficient documentation

## 2023-10-29 MED ORDER — GABAPENTIN 600 MG PO TABS
600.0000 mg | ORAL_TABLET | Freq: Every day | ORAL | 3 refills | Status: DC
  Filled 2023-10-29: qty 30, 30d supply, fill #0
  Filled 2023-12-03: qty 30, 30d supply, fill #1
  Filled 2024-01-01: qty 30, 30d supply, fill #2
  Filled 2024-02-06: qty 30, 30d supply, fill #3

## 2023-10-29 MED ORDER — VENLAFAXINE HCL ER 37.5 MG PO CP24
37.5000 mg | ORAL_CAPSULE | Freq: Every day | ORAL | 3 refills | Status: DC
  Filled 2023-10-29: qty 30, 30d supply, fill #0

## 2023-10-29 NOTE — Progress Notes (Signed)
 48 y.o. GYN presents for AEX/PAP/STD screening.  C/o skipping periods, mood swings, insomnia, fatigue, lack of concentration, and vaginal itching.  Wants to know if pre menopause would cause these sx.

## 2023-10-29 NOTE — Progress Notes (Signed)
 Subjective:     Joanne Hughes is a 48 y.o. female P1 with LMP 10/29/23 and BMI 30 who is here for a comprehensive physical exam. The patient reports insomnia, irritable mood, difficulty focusing and decrease libido over the past 5-12 months. Patient also reports irregular menses often skipping 2-3 months. She describes her low libido as a lack of initiation but reports good relationship with her husband and very much attracted to him. Patient denies urinary incontinence or issues with bowel movement. Patient is without any other complaints  Past Medical History:  Diagnosis Date   Arthritis 08/2016   Arthritis in both knees   Elevated LDL cholesterol level 10/30/2018   Hidradenitis    Hirsutism    tried spironolactone and vaniqa in past without success   History of elevated lipids    History of prediabetes    A1c 5.9% January 2017   Hypothyroid    IUD contraception    MRSA (methicillin resistant Staphylococcus aureus)    per medical record from [place]    PCOS (polycystic ovarian syndrome)    Sickle cell anemia (HCC)    Vitamin D  deficiency 11/03/2018   Past Surgical History:  Procedure Laterality Date   BREAST REDUCTION SURGERY  05/1997   c4-c5 anterior cervical discectomy  10/12/2022   and anthroplasty-Dr.Jenkins   CESAREAN SECTION     CHOLECYSTECTOMY     gastric sleeve surgery  08/2015   surgeon Dr. Katrina Parma of [place]    REDUCTION MAMMAPLASTY Bilateral    THYROIDECTOMY  06/04/2013   surgeon Dr. Starla Easterly of [place]   Family History  Problem Relation Age of Onset   Hypertension Mother    Hypothyroidism Mother    Hypertension Father    Stroke Father    Diabetes Sister    Leukemia Sister    Breast cancer Cousin    Colon cancer Neg Hx    Colon polyps Neg Hx    Esophageal cancer Neg Hx    Rectal cancer Neg Hx    Stomach cancer Neg Hx    Social History   Socioeconomic History   Marital status: Married    Spouse name: Not on file   Number of children: Not on file    Years of education: Not on file   Highest education level: Not on file  Occupational History   Not on file  Tobacco Use   Smoking status: Never   Smokeless tobacco: Never  Vaping Use   Vaping status: Never Used  Substance and Sexual Activity   Alcohol use: Yes    Alcohol/week: 2.0 standard drinks of alcohol    Types: 2 Glasses of wine per week   Drug use: No   Sexual activity: Yes    Birth control/protection: I.U.D.  Other Topics Concern   Not on file  Social History Narrative   Married, 49 yo son Joanne Hughes). No pets   5 stepchildren (adult)--9 grandchildren.      Works for [employer], travel agent   (Sedentary job).  Works remotely 4 days/week, in office Wednesdays.      Updated 11/2022   Social Drivers of Health   Financial Resource Strain: Low Risk  (11/27/2022)   Overall Financial Resource Strain (CARDIA)    Difficulty of Paying Living Expenses: Not very hard  Food Insecurity: No Food Insecurity (11/27/2022)   Hunger Vital Sign    Worried About Running Out of Food in the Last Year: Never true    Ran Out of Food in the Last  Year: Never true  Transportation Needs: No Transportation Needs (11/27/2022)   PRAPARE - Administrator, Civil Service (Medical): No    Lack of Transportation (Non-Medical): No  Physical Activity: Insufficiently Active (11/27/2022)   Exercise Vital Sign    Days of Exercise per Week: 3 days    Minutes of Exercise per Session: 30 min  Stress: No Stress Concern Present (11/27/2022)   [employer] of Occupational Health - Occupational Stress Questionnaire    Feeling of Stress : Only a little  Social Connections: Moderately Integrated (11/27/2022)   Social Connection and Isolation Panel [NHANES]    Frequency of Communication with Friends and Family: More than three times a week    Frequency of Social Gatherings with Friends and Family: Once a week    Attends Religious Services: More than 4 times per year    Active Member of  [employer] or Organizations: No    Attends Banker Meetings: Never    Marital Status: Married  Catering manager Violence: Not At Risk (11/27/2022)   Humiliation, Afraid, Rape, and Kick questionnaire    Fear of Current or Ex-Partner: No    Emotionally Abused: No    Physically Abused: No    Sexually Abused: No   Health Maintenance  Topic Date Due   HIV Screening  Never done   Hepatitis C Screening  Never done   Pneumococcal Vaccine 64-73 Years old (1 of 2 - PCV) Never done   COVID-19 Vaccine (4 - 2024-25 season) 01/21/2023   INFLUENZA VACCINE  12/21/2023   DTaP/Tdap/Td (3 - Td or Tdap) 03/01/2026   Cervical Cancer Screening (HPV/Pap Cotest)  06/16/2026   Colonoscopy  01/21/2032   HPV VACCINES  Aged Out   Meningococcal B Vaccine  Aged Out       Review of Systems Pertinent items noted in HPI and remainder of comprehensive ROS otherwise negative.   Objective:  Blood pressure 134/87, pulse 85, height 5\' 6"  (1.676 m), weight 189 lb (85.7 kg), last menstrual period 10/29/2023.   GENERAL: Well-developed, well-nourished female in no acute distress.  HEENT: Normocephalic, atraumatic. Sclerae anicteric.  NECK: Supple. Normal thyroid .  LUNGS: Clear to auscultation bilaterally.  HEART: Regular rate and rhythm. BREASTS: Symmetric in size. No palpable masses or lymphadenopathy, skin changes, or nipple drainage. ABDOMEN: Soft, nontender, nondistended. No organomegaly. PELVIC: Normal external female genitalia. Vagina is pink and rugated.  Normal discharge. Normal appearing cervix. Uterus is normal in size. No adnexal mass or tenderness. Chaperone present during the pelvic exam EXTREMITIES: No cyanosis, clubbing, or edema, 2+ distal pulses.     Assessment:    Healthy female exam.      Plan:    - pap smear collected - Screening mammogram ordered - vaginal swab collected - Patient opted to defer labs with PCP or when she returns in 3 months - Patient with Mirena  IUD since 2018  and is aware of need for removal. Patient desires new IUD. She declined removal and reinsertion today and plans to return in 3 months to do so - Patient will be contacted with abnormal results - Rx gabapentin and Effexor provided to assist with some of her peri-menopausal symptoms. RTC in 3 for follow up on symptoms  See After Visit Summary for Counseling Recommendations

## 2023-10-30 ENCOUNTER — Other Ambulatory Visit: Payer: Self-pay | Admitting: Family Medicine

## 2023-10-30 ENCOUNTER — Other Ambulatory Visit (HOSPITAL_COMMUNITY): Payer: Self-pay

## 2023-10-30 DIAGNOSIS — M5412 Radiculopathy, cervical region: Secondary | ICD-10-CM | POA: Diagnosis not present

## 2023-10-30 DIAGNOSIS — E890 Postprocedural hypothyroidism: Secondary | ICD-10-CM

## 2023-10-30 DIAGNOSIS — M5020 Other cervical disc displacement, unspecified cervical region: Secondary | ICD-10-CM | POA: Diagnosis not present

## 2023-10-30 MED ORDER — LEVOTHYROXINE SODIUM 137 MCG PO TABS
137.0000 ug | ORAL_TABLET | Freq: Every day | ORAL | 1 refills | Status: DC
  Filled 2023-10-30 – 2023-11-09 (×2): qty 90, 90d supply, fill #0

## 2023-10-31 LAB — CYTOLOGY - PAP
Comment: NEGATIVE
Diagnosis: NEGATIVE
High risk HPV: NEGATIVE

## 2023-10-31 LAB — CERVICOVAGINAL ANCILLARY ONLY
Chlamydia: NEGATIVE
Comment: NEGATIVE
Comment: NORMAL
Neisseria Gonorrhea: NEGATIVE

## 2023-11-06 ENCOUNTER — Ambulatory Visit
Admission: RE | Admit: 2023-11-06 | Discharge: 2023-11-06 | Disposition: A | Discharge status: Home / Self Care | Source: Ambulatory Visit | Attending: Obstetrics and Gynecology | Admitting: Obstetrics and Gynecology

## 2023-11-06 DIAGNOSIS — Z1231 Encounter for screening mammogram for malignant neoplasm of breast: Secondary | ICD-10-CM | POA: Diagnosis not present

## 2023-11-06 DIAGNOSIS — Z01419 Encounter for gynecological examination (general) (routine) without abnormal findings: Secondary | ICD-10-CM

## 2023-11-07 ENCOUNTER — Encounter: Payer: Self-pay | Admitting: Obstetrics and Gynecology

## 2023-11-08 ENCOUNTER — Other Ambulatory Visit: Payer: Self-pay | Admitting: Obstetrics and Gynecology

## 2023-11-08 ENCOUNTER — Encounter (HOSPITAL_COMMUNITY): Payer: Self-pay

## 2023-11-08 ENCOUNTER — Other Ambulatory Visit (HOSPITAL_COMMUNITY): Payer: Self-pay

## 2023-11-08 MED ORDER — NUVESSA 1.3 % VA GEL
1.0000 | Freq: Every evening | VAGINAL | 0 refills | Status: AC
  Filled 2023-11-08 (×2): qty 5, 1d supply, fill #0

## 2023-11-09 ENCOUNTER — Other Ambulatory Visit (HOSPITAL_COMMUNITY): Payer: Self-pay

## 2023-11-28 NOTE — Patient Instructions (Incomplete)
  HEALTH MAINTENANCE RECOMMENDATIONS:  It is recommended that you get at least 30 minutes of aerobic exercise at least 5 days/week (for weight loss, you may need as much as 60-90 minutes). This can be any activity that gets your heart rate up. This can be divided in 10-15 minute intervals if needed, but try and build up your endurance at least once a week.  Weight bearing exercise is also recommended twice weekly.  Eat a healthy diet with lots of vegetables, fruits and fiber.  Colorful foods have a lot of vitamins (ie green vegetables, tomatoes, red peppers, etc).  Limit sweet tea, regular sodas and alcoholic beverages, all of which has a lot of calories and sugar.  Up to 1 alcoholic drink daily may be beneficial for women (unless trying to lose weight, watch sugars).  Drink a lot of water.  Calcium recommendations are 1200-1500 mg daily (1500 mg for postmenopausal women or women without ovaries), and vitamin D  1000 IU daily.  This should be obtained from diet and/or supplements (vitamins), and calcium should not be taken all at once, but in divided doses.  Monthly self breast exams and yearly mammograms for women over the age of 20 is recommended.  Sunscreen of at least SPF 30 should be used on all sun-exposed parts of the skin when outside between the hours of 10 am and 4 pm (not just when at beach or pool, but even with exercise, golf, tennis, and yard work!)  Use a sunscreen that says broad spectrum so it covers both UVA and UVB rays, and make sure to reapply every 1-2 hours.  Remember to change the batteries in your smoke detectors when changing your clock times in the spring and fall. Carbon monoxide detectors are recommended for your home.  Use your seat belt every time you are in a car, and please drive safely and not be distracted with cell phones and texting while driving.  Shingles vaccine is recommended at age 54, as well as pneumonia vaccine.  We will discuss these further next  year.  Consider adding in yoga once a week (onli ne, example, yoga by [employer]).  I encourage you to consider getting the updated COVID vaccine when it is available in September.  If you can get enough calcium in your diet, you may not need to take the gummy twice a day.

## 2023-11-28 NOTE — Progress Notes (Signed)
 Chief Complaint  Patient presents with   Annual Exam    CPE fasting labs, having perimenopausal, brain fog, forgetfulness, no hot flashes,  not sleeping but recently started gabapentin  and that has helped with that, does have mood swings, pt. Had flu shot last fall could not remember exact date some time in Sept. Pt. Is not taking any biotin   Joanne Hughes is a 48 y.o. female who presents for a complete physical.  She saw her GYN for WWE in 10/2023.  She reports feeling the best she has in a long time, since starting gabapentin . Prior to this she had brain fog, memory loss, insomnia, wasn't having hot flashes or night sweats. She did have some mood swings, crying for no reason. She never started the effexor  prescribed by her GYN. Since taking gabapentin , she is sleeping much better. She is also taking meno-chill from [employer], which contains B6, vitamin E, black cohosh, ashwagandha, ginseng, flaxseed, lactobacillus, among other things. She has taken this for 6 weeks, and notes improved mood, less brain fog, focusing better (not yet perfect, but improving). Label reviewed--contains 60 mcg vitamin D = 2400 IU  Pre-diabetes:  A1c 5.8% last year.  Fasting sugar was normal. She exercises regularly and has lost weight in the last year. Due for recheck.  H/o vitamin D  deficiency: She is currently taking 5000 IU just once a week (cut back from daily 2-3 months ago). She is also getting 2400 IU from the Meno-[employer]ill vitamins, taken daily x 6 weeks. (Taking the full serving of 3/day).  Component Ref Range & Units (hover) 3 yr ago 5 yr ago  Vit D, 25-Hydroxy 33.0 25.1 Low     Obesity: She reports she weighed 293# prior to gastric bypass 08/2015. Got down to 190-193#. Regained some during COVID.  Was 207# at her physical last year. She is down almost 30# from that today.  She eats 2 meals/day, no breakfast. She eats a lot of salad, salmon. Doesn't eat fast food, cut out soda, and sweet  tea. She mainly drinks seltzer.  Husband juices a lot, so lots of fruit/vegetables that way She has a walking pad, gets 8-10K steps 4 days/week. She is back to using resistance bands (had to wait a year from neck surgery), dumbbells. She has been doing a lot of line dancing.   Immunization History  Administered Date(s) Administered   Influenza,inj,Quad PF,6+ Mos 03/01/2017, 01/20/2021   Influenza-Unspecified 03/01/2016, 03/01/2017, 02/08/2018, 02/03/2022   PFIZER(Purple Top)SARS-COV-2 Vaccination 05/31/2019, 06/20/2019, 06/04/2020   Td 05/23/2007   Tdap 03/01/2016  Gets flu shot yearly (for work). Last Pap smear: 10/2023, NILM, negative HPV, shift in flora c/w BV (done by GYN) Last mammogram: 10/2023 Last colonoscopy: 01/2022 Dr. Eda, normal Last DEXA: none Dentist: twice yearly (past due, plans to schedule). Ophtho: Due (prev going to Dr. Octavia every 6 months, borderline enlarged optical nerve, per pt)  Exercise: Line dancing, 8-10K steps 4 days/week using walking pad.  She uses resistance bands and dumbbells.   Lipids: Lab Results  Component Value Date   CHOL 212 (H) 11/27/2022   HDL 84 11/27/2022   LDLCALC 119 (H) 11/27/2022   TRIG 51 11/27/2022   CHOLHDL 2.5 11/27/2022     PMH, PSH, SH and FH were reviewed and updated  Outpatient Encounter Medications as of 11/29/2023  Medication Sig Note   CALCIUM CITRATE PO Take 1 tablet by mouth in the morning and at bedtime. 11/27/2022: Gummy 500 mg   Cholecalciferol (VITAMIN D )  125 MCG (5000 UT) CAPS Take 1 capsule by mouth daily. 11/29/2023: Taking it once a week for 2-3 months. She is also getting 2400 IU from her [employer]o-[employer]ill vitamins, taking x 6 weeks.   cyanocobalamin (VITAMIN B12) 1000 MCG tablet Take 1,000 mcg by mouth daily.    fluticasone  (CUTIVATE ) 0.05 % cream Apply to affected areas up to 2 (two) times daily as needed.    gabapentin  (NEURONTIN ) 600 MG tablet Take 1 tablet (600 mg total) by mouth at bedtime. 11/29/2023: Takes  300-600 mg at bedtime (for perimenopause/insomnia)   ketoconazole  (NIZORAL ) 2 % shampoo Apply 1 application topically 2 (two) times a week. 11/27/2022: As needed   L-Theanine 200 MG CAPS Take 1 capsule by mouth daily.    levonorgestrel  (MIRENA ) 20 MCG/24HR IUD 1 each by Intrauterine route once.    levothyroxine  (SYNTHROID ) 137 MCG tablet Take 1 tablet (137 mcg total) by mouth daily before breakfast.    Multiple Vitamin (MULTIVITAMIN) tablet Take 1 tablet by mouth daily. 11/28/2022: Emelia Mallory (contains biotin)   NON FORMULARY Take 1 tablet by mouth daily. [employer] [facility] meno-chill    venlafaxine  XR (EFFEXOR  XR) 37.5 MG 24 hr capsule Take 1 capsule (37.5 mg total) by mouth daily with breakfast. (Patient not taking: Reported on 11/29/2023)    [DISCONTINUED] oxyCODONE  (ROXICODONE ) 5 MG immediate release tablet Take 1 tablet (5 mg total) by mouth every 6 (six) hours as needed for severe pain. (Patient not taking: Reported on 11/27/2022) 11/27/2022: Has not used in 3 weeks   [DISCONTINUED] oxyCODONE -acetaminophen  (PERCOCET/ROXICET) 5-325 MG tablet Take 1 tablet by mouth every 4 (four) hours as needed for pain (Patient not taking: Reported on 11/27/2022) 11/27/2022: As not used in 3 weeks   [DISCONTINUED] predniSONE  (STERAPRED UNI-PAK 21 TAB) 10 MG (21) TBPK tablet 6 day taper; take as directed on package instructions    [DISCONTINUED] trimethoprim -polymyxin b  (POLYTRIM ) ophthalmic solution Place 1-2 drops into affected eye(s) 4 (four) times daily for 5 days.    No facility-administered encounter medications on file as of 11/29/2023.   No Known Allergies   ROS:  The patient denies anorexia, fever, headaches, vision changes, decreased hearing, ear pain, sore throat, breast concerns, chest pain, palpitations, dizziness, syncope, dyspnea on exertion, cough, swelling, nausea, vomiting, diarrhea, constipation, abdominal pain, melena, hematochezia, indigestion/heartburn, hematuria, incontinence, dysuria,  vaginal discharge, odor or itch, genital lesions, joint pains, numbness, tingling, weakness, tremor, suspicious skin lesions, depression, anxiety, abnormal bleeding/bruising, or enlarged lymph nodes.  Decreased libido, unchanged (mainly due to fatigue); denies vaginal dryness or painful intercourse. Menstrual cycles about every 2 months, somewhat irregular, and with some spotting; has Mirena  IUD. Denies neck pain or stiffness. Denies any joint pain (knees pop, no pain). Still has slight brain fog, but improving. Perimenopausal symptoms improved, per HPI. Sleeping well with gabapentin .   PHYSICAL EXAM:  BP 128/80   Pulse 73   Ht 5' 5.25 (1.657 m)   Wt 178 lb 3.2 oz (80.8 kg)   LMP 10/29/2023   BMI 29.43 kg/m   Wt Readings from Last 3 Encounters:  11/29/23 178 lb 3.2 oz (80.8 kg)  10/29/23 189 lb (85.7 kg)  11/27/22 207 lb 9.6 oz (94.2 kg)   General Appearance:    Alert, cooperative, no distress, appears stated age  Head:    Normocephalic, without obvious abnormality, atraumatic  Eyes:    PERRL, conjunctiva/corneas clear, EOM's intact, fundi benign  Ears:   Normal TM's and external ear canals  Nose:  No drainage or  sinus tenderness  Throat:  Normal mucosa, no lesions  Neck:   Supple, no lymphadenopathy;  thyroid :  surgically absent, not palpable; no carotid bruit or JVD. WHSS anterior neck.  Back:    Spine nontender, no curvature, no CVA tenderness  Lungs:     Clear to auscultation bilaterally without wheezes, rales or ronchi; respirations unlabored  Chest Wall:    No tenderness or deformity   Heart:    Regular rate and rhythm, S1 and S2 normal, no murmur, rub or gallop  Breast Exam:   Per OB/GYN  Abdomen:     Soft, non-tender, nondistended, normoactive bowel sounds, no masses, no hepatosplenomegaly  Genitalia:   Per OB/GYN       Extremities:   No clubbing, cyanosis or edema  Pulses:   2+ and symmetric all extremities  Skin:   Skin color, texture, turgor normal, no rashes or  lesions. WHSS anterior neck  Lymph nodes:   Cervical, supraclavicular, and axillary nodes normal  Neurologic:   CNII-XII intact, normal strength, sensation and gait; reflexes 2+ and symmetric throughout                 Psych:   Normal mood, affect, hygiene and grooming.   ASSESSMENT/PLAN:  Annual physical exam - Plan: Hemoglobin A1c, CBC with Differential/Platelet, Comprehensive metabolic panel with GFR, Lipid panel, TSH, T4, Free  Postoperative hypothyroidism - Due for TSH.  Has lost weight, may need lower dose. Clinically euthyroid. Adjust based on lab results - Plan: TSH, T4, Free  Prediabetes - diet/exercise/weight all improved.  Due for A1c - Plan: Hemoglobin A1c, Comprehensive metabolic panel with GFR  Elevated LDL cholesterol level - improved with dietary changes.  Diet remains good. - Plan: Lipid panel  Perimenopausal symptoms - improved with gabapentin  (sleep, mood swings, brain fog/concentration is improving).  Has irregular menses, no HF/NS  Vitamin D  deficiency - will recheck, due to change in supplements. - Plan: VITAMIN D  25 Hydroxy (Vit-D Deficiency, Fractures)  Medication monitoring encounter - Plan: TSH, T4, Free, VITAMIN D  25 Hydroxy (Vit-D Deficiency, Fractures)   Discussed monthly self breast exams and yearly mammograms; at least 30 minutes of aerobic activity at least 5 days/week, weight-bearing exercise at least 2x/week; proper sunscreen use reviewed; healthy diet, including goals of calcium and vitamin D  intake and alcohol recommendations (less than or equal to 1 drink/day) reviewed; regular seatbelt use; changing batteries in smoke detectors.  Immunization recommendations discussed--continue yearly flu shots. Updated COVID booster when available in the Fall.   Discussed Shingrix and pneumonia vaccines at the age of 45. Colonoscopy recommendations reviewed, up to date (10 year f/u recommended)   F/u 1 year for CPE, sooner prn, based on labs or other  concerns.  ADDENDUM: Lab Results  Component Value Date   TSH 0.147 (L) 11/29/2023   T3TOTAL 102 11/27/2022   T4TOTAL 6.9 12/01/2021   Synthroid  dose decreased to 125 mcg. Will recheck labs in 6-8 weeks.

## 2023-11-29 ENCOUNTER — Ambulatory Visit: Payer: 59 | Admitting: Family Medicine

## 2023-11-29 ENCOUNTER — Encounter: Payer: Self-pay | Admitting: Family Medicine

## 2023-11-29 VITALS — BP 128/80 | HR 73 | Ht 65.25 in | Wt 178.2 lb

## 2023-11-29 DIAGNOSIS — R7303 Prediabetes: Secondary | ICD-10-CM

## 2023-11-29 DIAGNOSIS — N951 Menopausal and female climacteric states: Secondary | ICD-10-CM

## 2023-11-29 DIAGNOSIS — E7800 Pure hypercholesterolemia, unspecified: Secondary | ICD-10-CM | POA: Diagnosis not present

## 2023-11-29 DIAGNOSIS — Z0000 Encounter for general adult medical examination without abnormal findings: Secondary | ICD-10-CM

## 2023-11-29 DIAGNOSIS — E559 Vitamin D deficiency, unspecified: Secondary | ICD-10-CM

## 2023-11-29 DIAGNOSIS — E890 Postprocedural hypothyroidism: Secondary | ICD-10-CM | POA: Diagnosis not present

## 2023-11-29 DIAGNOSIS — Z5181 Encounter for therapeutic drug level monitoring: Secondary | ICD-10-CM | POA: Diagnosis not present

## 2023-11-30 ENCOUNTER — Ambulatory Visit: Payer: Self-pay | Admitting: Family Medicine

## 2023-11-30 ENCOUNTER — Encounter: Payer: Self-pay | Admitting: Family Medicine

## 2023-11-30 ENCOUNTER — Other Ambulatory Visit: Payer: Self-pay

## 2023-11-30 ENCOUNTER — Other Ambulatory Visit (HOSPITAL_COMMUNITY): Payer: Self-pay

## 2023-11-30 DIAGNOSIS — Z5181 Encounter for therapeutic drug level monitoring: Secondary | ICD-10-CM

## 2023-11-30 DIAGNOSIS — E890 Postprocedural hypothyroidism: Secondary | ICD-10-CM

## 2023-11-30 LAB — COMPREHENSIVE METABOLIC PANEL WITH GFR
ALT: 12 IU/L (ref 0–32)
AST: 16 IU/L (ref 0–40)
Albumin: 4.3 g/dL (ref 3.9–4.9)
Alkaline Phosphatase: 76 IU/L (ref 44–121)
BUN/Creatinine Ratio: 18 (ref 9–23)
BUN: 13 mg/dL (ref 6–24)
Bilirubin Total: 0.8 mg/dL (ref 0.0–1.2)
CO2: 22 mmol/L (ref 20–29)
Calcium: 9.5 mg/dL (ref 8.7–10.2)
Chloride: 107 mmol/L — ABNORMAL HIGH (ref 96–106)
Creatinine, Ser: 0.73 mg/dL (ref 0.57–1.00)
Globulin, Total: 2.4 g/dL (ref 1.5–4.5)
Glucose: 85 mg/dL (ref 70–99)
Potassium: 4.6 mmol/L (ref 3.5–5.2)
Sodium: 144 mmol/L (ref 134–144)
Total Protein: 6.7 g/dL (ref 6.0–8.5)
eGFR: 101 mL/min/1.73 (ref >59)

## 2023-11-30 LAB — CBC WITH DIFFERENTIAL/PLATELET
Basophils Absolute: 0 x10E3/uL (ref 0.0–0.2)
Basos: 1 %
EOS (ABSOLUTE): 0.1 x10E3/uL (ref 0.0–0.4)
Eos: 2 %
Hematocrit: 35.6 % (ref 34.0–46.6)
Hemoglobin: 10.9 g/dL — ABNORMAL LOW (ref 11.1–15.9)
Immature Grans (Abs): 0 x10E3/uL (ref 0.0–0.1)
Immature Granulocytes: 0 %
Lymphocytes Absolute: 1.8 x10E3/uL (ref 0.7–3.1)
Lymphs: 39 %
MCH: 27.8 pg (ref 26.6–33.0)
MCHC: 30.6 g/dL — ABNORMAL LOW (ref 31.5–35.7)
MCV: 91 fL (ref 79–97)
Monocytes Absolute: 0.3 x10E3/uL (ref 0.1–0.9)
Monocytes: 7 %
Neutrophils Absolute: 2.3 x10E3/uL (ref 1.4–7.0)
Neutrophils: 51 %
Platelets: 298 x10E3/uL (ref 150–450)
RBC: 3.92 x10E6/uL (ref 3.77–5.28)
RDW: 13.3 % (ref 11.7–15.4)
WBC: 4.5 x10E3/uL (ref 3.4–10.8)

## 2023-11-30 LAB — VITAMIN D 25 HYDROXY (VIT D DEFICIENCY, FRACTURES): Vit D, 25-Hydroxy: 73.7 ng/mL (ref 30.0–100.0)

## 2023-11-30 LAB — LIPID PANEL
Chol/HDL Ratio: 2.7 ratio (ref 0.0–4.4)
Cholesterol, Total: 203 mg/dL — ABNORMAL HIGH (ref 100–199)
HDL: 76 mg/dL (ref >39)
LDL Chol Calc (NIH): 118 mg/dL — ABNORMAL HIGH (ref 0–99)
Triglycerides: 46 mg/dL (ref 0–149)
VLDL Cholesterol Cal: 9 mg/dL (ref 5–40)

## 2023-11-30 LAB — HEMOGLOBIN A1C
Est. average glucose Bld gHb Est-mCnc: 111 mg/dL
Hgb A1c MFr Bld: 5.5 % (ref 4.8–5.6)

## 2023-11-30 LAB — TSH: TSH: 0.147 u[IU]/mL — ABNORMAL LOW (ref 0.450–4.500)

## 2023-11-30 LAB — T4, FREE: Free T4: 1.49 ng/dL (ref 0.82–1.77)

## 2023-11-30 MED ORDER — LEVOTHYROXINE SODIUM 125 MCG PO TABS
125.0000 ug | ORAL_TABLET | Freq: Every day | ORAL | 1 refills | Status: DC
  Filled 2023-11-30: qty 30, 30d supply, fill #0
  Filled 2024-01-01: qty 30, 30d supply, fill #1

## 2023-11-30 NOTE — Progress Notes (Signed)
 Please send in new dose of Synthroid  of 125 mcg, #30 with 1 refill (she shouldn't have been given a refill of the #90 that was sent in June, with labs being due), and d/c the 137 mcg dose. Please enter future orders for TSH and free T4 for 6-8 weeks, and schedule lab visit. (Dx hypothyroidism, med monitor)  Also, it appears that her physical for next year (11/2024) was scheduled with [facility], rather than with me.  Please change this (unless this was an intentional change by the patient--her husband switched to [facility] due to only being able to come to the office on Fridays).  Thanks!

## 2023-12-03 ENCOUNTER — Other Ambulatory Visit (HOSPITAL_COMMUNITY): Payer: Self-pay

## 2024-01-18 ENCOUNTER — Other Ambulatory Visit

## 2024-01-18 DIAGNOSIS — Z5181 Encounter for therapeutic drug level monitoring: Secondary | ICD-10-CM

## 2024-01-18 DIAGNOSIS — E890 Postprocedural hypothyroidism: Secondary | ICD-10-CM

## 2024-01-23 ENCOUNTER — Other Ambulatory Visit

## 2024-01-23 DIAGNOSIS — Z5181 Encounter for therapeutic drug level monitoring: Secondary | ICD-10-CM | POA: Diagnosis not present

## 2024-01-23 DIAGNOSIS — E890 Postprocedural hypothyroidism: Secondary | ICD-10-CM | POA: Diagnosis not present

## 2024-01-24 ENCOUNTER — Other Ambulatory Visit (HOSPITAL_COMMUNITY): Payer: Self-pay

## 2024-01-24 ENCOUNTER — Ambulatory Visit: Payer: Self-pay | Admitting: Family Medicine

## 2024-01-24 ENCOUNTER — Other Ambulatory Visit: Payer: Self-pay | Admitting: *Deleted

## 2024-01-24 DIAGNOSIS — E890 Postprocedural hypothyroidism: Secondary | ICD-10-CM

## 2024-01-24 LAB — T4, FREE: Free T4: 1.15 ng/dL (ref 0.82–1.77)

## 2024-01-24 LAB — TSH: TSH: 2.19 u[IU]/mL (ref 0.450–4.500)

## 2024-01-24 MED ORDER — LEVOTHYROXINE SODIUM 125 MCG PO TABS
125.0000 ug | ORAL_TABLET | Freq: Every day | ORAL | 1 refills | Status: DC
  Filled 2024-01-24 – 2024-01-25 (×2): qty 90, 90d supply, fill #0
  Filled 2024-04-27 – 2024-04-28 (×3): qty 90, 90d supply, fill #1

## 2024-01-25 ENCOUNTER — Other Ambulatory Visit (HOSPITAL_COMMUNITY): Payer: Self-pay

## 2024-02-06 ENCOUNTER — Other Ambulatory Visit (HOSPITAL_COMMUNITY): Payer: Self-pay

## 2024-02-09 DIAGNOSIS — H5203 Hypermetropia, bilateral: Secondary | ICD-10-CM | POA: Diagnosis not present

## 2024-03-02 ENCOUNTER — Other Ambulatory Visit: Payer: Self-pay | Admitting: Obstetrics and Gynecology

## 2024-03-07 ENCOUNTER — Other Ambulatory Visit (HOSPITAL_COMMUNITY): Payer: Self-pay

## 2024-03-10 ENCOUNTER — Other Ambulatory Visit (HOSPITAL_COMMUNITY): Payer: Self-pay

## 2024-03-18 ENCOUNTER — Other Ambulatory Visit: Payer: Self-pay | Admitting: Obstetrics and Gynecology

## 2024-03-21 ENCOUNTER — Other Ambulatory Visit (HOSPITAL_COMMUNITY): Payer: Self-pay

## 2024-03-21 ENCOUNTER — Other Ambulatory Visit: Payer: Self-pay | Admitting: Obstetrics and Gynecology

## 2024-03-21 ENCOUNTER — Telehealth: Payer: Self-pay

## 2024-03-21 MED ORDER — GABAPENTIN 600 MG PO TABS
600.0000 mg | ORAL_TABLET | Freq: Every day | ORAL | 6 refills | Status: AC
  Filled 2024-03-21: qty 30, 30d supply, fill #0
  Filled 2024-04-27: qty 30, 30d supply, fill #1
  Filled 2024-06-05: qty 30, 30d supply, fill #2

## 2024-03-21 NOTE — Telephone Encounter (Signed)
 Returned call, advised that provider sent refill on gabapentin .

## 2024-04-28 ENCOUNTER — Other Ambulatory Visit (HOSPITAL_COMMUNITY): Payer: Self-pay

## 2024-04-28 ENCOUNTER — Other Ambulatory Visit: Payer: Self-pay

## 2024-06-05 ENCOUNTER — Other Ambulatory Visit: Payer: Self-pay

## 2024-06-18 ENCOUNTER — Other Ambulatory Visit: Payer: Self-pay | Admitting: Family Medicine

## 2024-06-18 DIAGNOSIS — E890 Postprocedural hypothyroidism: Secondary | ICD-10-CM

## 2024-06-19 ENCOUNTER — Other Ambulatory Visit (HOSPITAL_COMMUNITY): Payer: Self-pay

## 2024-06-19 MED ORDER — LEVOTHYROXINE SODIUM 125 MCG PO TABS
125.0000 ug | ORAL_TABLET | Freq: Every day | ORAL | 0 refills | Status: AC
  Filled 2024-06-19: qty 90, 90d supply, fill #0

## 2024-06-24 ENCOUNTER — Other Ambulatory Visit (HOSPITAL_COMMUNITY): Payer: Self-pay

## 2024-07-23 ENCOUNTER — Other Ambulatory Visit: Payer: Self-pay

## 2024-12-11 ENCOUNTER — Encounter: Admitting: Medical

## 2024-12-18 ENCOUNTER — Encounter: Payer: Self-pay | Admitting: Family Medicine
